# Patient Record
Sex: Female | Born: 1945 | Race: White | Hispanic: No | Smoking: Former smoker
Health system: Southern US, Community
[De-identification: ages and names within clinical notes are randomized; demographics above are authoritative.]

## PROBLEM LIST (undated history)

## (undated) DIAGNOSIS — M797 Fibromyalgia: Secondary | ICD-10-CM

## (undated) DIAGNOSIS — F319 Bipolar disorder, unspecified: Secondary | ICD-10-CM

## (undated) DIAGNOSIS — M1990 Unspecified osteoarthritis, unspecified site: Secondary | ICD-10-CM

## (undated) DIAGNOSIS — R32 Unspecified urinary incontinence: Secondary | ICD-10-CM

## (undated) DIAGNOSIS — F329 Major depressive disorder, single episode, unspecified: Secondary | ICD-10-CM

## (undated) DIAGNOSIS — K219 Gastro-esophageal reflux disease without esophagitis: Secondary | ICD-10-CM

## (undated) DIAGNOSIS — T4145XA Adverse effect of unspecified anesthetic, initial encounter: Secondary | ICD-10-CM

## (undated) DIAGNOSIS — R0602 Shortness of breath: Secondary | ICD-10-CM

## (undated) DIAGNOSIS — E063 Autoimmune thyroiditis: Secondary | ICD-10-CM

## (undated) DIAGNOSIS — T8859XA Other complications of anesthesia, initial encounter: Secondary | ICD-10-CM

## (undated) DIAGNOSIS — E119 Type 2 diabetes mellitus without complications: Secondary | ICD-10-CM

## (undated) DIAGNOSIS — I10 Essential (primary) hypertension: Secondary | ICD-10-CM

## (undated) DIAGNOSIS — F32A Depression, unspecified: Secondary | ICD-10-CM

## (undated) HISTORY — DX: Unspecified osteoarthritis, unspecified site: M19.90

## (undated) HISTORY — PX: TUBAL LIGATION: SHX77

## (undated) HISTORY — PX: COLONOSCOPY: SHX174

## (undated) HISTORY — DX: Fibromyalgia: M79.7

## (undated) HISTORY — DX: Essential (primary) hypertension: I10

## (undated) HISTORY — DX: Type 2 diabetes mellitus without complications: E11.9

## (undated) HISTORY — PX: TONSILLECTOMY: SUR1361

## (undated) HISTORY — DX: Autoimmune thyroiditis: E06.3

## (undated) HISTORY — PX: ANKLE SURGERY: SHX546

## (undated) HISTORY — DX: Unspecified urinary incontinence: R32

---

## 1984-03-24 HISTORY — PX: BREAST SURGERY: SHX581

## 1984-03-24 HISTORY — PX: DILATION AND CURETTAGE OF UTERUS: SHX78

## 2010-10-23 ENCOUNTER — Ambulatory Visit: Payer: Self-pay

## 2012-10-08 ENCOUNTER — Ambulatory Visit: Payer: Self-pay

## 2013-01-07 ENCOUNTER — Encounter (INDEPENDENT_AMBULATORY_CARE_PROVIDER_SITE_OTHER): Payer: Self-pay | Admitting: Surgery

## 2013-01-07 ENCOUNTER — Ambulatory Visit (INDEPENDENT_AMBULATORY_CARE_PROVIDER_SITE_OTHER): Payer: Medicare Other | Admitting: Surgery

## 2013-01-07 ENCOUNTER — Other Ambulatory Visit (INDEPENDENT_AMBULATORY_CARE_PROVIDER_SITE_OTHER): Payer: Self-pay

## 2013-01-07 VITALS — BP 140/70 | HR 76 | Temp 98.9°F | Resp 16 | Ht 62.0 in | Wt 290.6 lb

## 2013-01-07 DIAGNOSIS — M797 Fibromyalgia: Secondary | ICD-10-CM

## 2013-01-07 DIAGNOSIS — R32 Unspecified urinary incontinence: Secondary | ICD-10-CM

## 2013-01-07 DIAGNOSIS — Z862 Personal history of diseases of the blood and blood-forming organs and certain disorders involving the immune mechanism: Secondary | ICD-10-CM

## 2013-01-07 DIAGNOSIS — Z8639 Personal history of other endocrine, nutritional and metabolic disease: Secondary | ICD-10-CM | POA: Insufficient documentation

## 2013-01-07 DIAGNOSIS — E119 Type 2 diabetes mellitus without complications: Secondary | ICD-10-CM

## 2013-01-07 DIAGNOSIS — I10 Essential (primary) hypertension: Secondary | ICD-10-CM

## 2013-01-07 DIAGNOSIS — Z6841 Body Mass Index (BMI) 40.0 and over, adult: Secondary | ICD-10-CM

## 2013-01-07 DIAGNOSIS — IMO0001 Reserved for inherently not codable concepts without codable children: Secondary | ICD-10-CM

## 2013-01-07 NOTE — Patient Instructions (Signed)
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
## 2013-01-10 ENCOUNTER — Other Ambulatory Visit (INDEPENDENT_AMBULATORY_CARE_PROVIDER_SITE_OTHER): Payer: Self-pay

## 2013-01-10 DIAGNOSIS — E119 Type 2 diabetes mellitus without complications: Secondary | ICD-10-CM

## 2013-01-10 DIAGNOSIS — M797 Fibromyalgia: Secondary | ICD-10-CM

## 2013-01-10 DIAGNOSIS — I10 Essential (primary) hypertension: Secondary | ICD-10-CM

## 2013-01-21 ENCOUNTER — Ambulatory Visit (HOSPITAL_COMMUNITY): Admission: RE | Admit: 2013-01-21 | Payer: Self-pay | Source: Ambulatory Visit | Admitting: Surgery

## 2013-01-21 SURGERY — BREATH TEST, FOR HELICOBACTER PYLORI

## 2013-01-27 ENCOUNTER — Other Ambulatory Visit: Payer: Self-pay

## 2013-01-27 ENCOUNTER — Other Ambulatory Visit (INDEPENDENT_AMBULATORY_CARE_PROVIDER_SITE_OTHER): Payer: Self-pay | Admitting: Surgery

## 2013-01-27 ENCOUNTER — Ambulatory Visit (HOSPITAL_COMMUNITY)
Admission: RE | Admit: 2013-01-27 | Discharge: 2013-01-27 | Disposition: A | Discharge status: Home / Self Care | Payer: Medicare Other | Source: Ambulatory Visit | Attending: Surgery | Admitting: Surgery

## 2013-01-27 DIAGNOSIS — I10 Essential (primary) hypertension: Secondary | ICD-10-CM

## 2013-01-27 DIAGNOSIS — IMO0001 Reserved for inherently not codable concepts without codable children: Secondary | ICD-10-CM | POA: Insufficient documentation

## 2013-01-27 DIAGNOSIS — K219 Gastro-esophageal reflux disease without esophagitis: Secondary | ICD-10-CM | POA: Insufficient documentation

## 2013-01-27 DIAGNOSIS — E063 Autoimmune thyroiditis: Secondary | ICD-10-CM | POA: Insufficient documentation

## 2013-01-27 DIAGNOSIS — M797 Fibromyalgia: Secondary | ICD-10-CM

## 2013-01-27 DIAGNOSIS — K449 Diaphragmatic hernia without obstruction or gangrene: Secondary | ICD-10-CM | POA: Insufficient documentation

## 2013-01-27 DIAGNOSIS — Z8639 Personal history of other endocrine, nutritional and metabolic disease: Secondary | ICD-10-CM

## 2013-01-27 DIAGNOSIS — E119 Type 2 diabetes mellitus without complications: Secondary | ICD-10-CM

## 2013-01-27 DIAGNOSIS — M129 Arthropathy, unspecified: Secondary | ICD-10-CM | POA: Insufficient documentation

## 2013-01-27 DIAGNOSIS — Z1231 Encounter for screening mammogram for malignant neoplasm of breast: Secondary | ICD-10-CM | POA: Insufficient documentation

## 2013-01-27 DIAGNOSIS — Z7189 Other specified counseling: Secondary | ICD-10-CM

## 2013-01-27 DIAGNOSIS — E669 Obesity, unspecified: Secondary | ICD-10-CM

## 2013-01-27 DIAGNOSIS — R32 Unspecified urinary incontinence: Secondary | ICD-10-CM | POA: Insufficient documentation

## 2013-01-27 DIAGNOSIS — K224 Dyskinesia of esophagus: Secondary | ICD-10-CM | POA: Insufficient documentation

## 2013-01-27 DIAGNOSIS — Z6841 Body Mass Index (BMI) 40.0 and over, adult: Secondary | ICD-10-CM | POA: Insufficient documentation

## 2013-01-27 LAB — CBC WITH DIFFERENTIAL/PLATELET
Basophils Absolute: 0 10*3/uL (ref 0.0–0.1)
Basophils Relative: 0 % (ref 0–1)
Eosinophils Absolute: 0.1 10*3/uL (ref 0.0–0.7)
HCT: 39.4 % (ref 36.0–46.0)
MCH: 31.2 pg (ref 26.0–34.0)
MCHC: 34.3 g/dL (ref 30.0–36.0)
Neutrophils Relative %: 51 % (ref 43–77)
Platelets: 221 10*3/uL (ref 150–400)
RBC: 4.33 MIL/uL (ref 3.87–5.11)
RDW: 13.8 % (ref 11.5–15.5)

## 2013-01-27 LAB — HEMOGLOBIN A1C: Hgb A1c MFr Bld: 6.3 % — ABNORMAL HIGH (ref <5.7)

## 2013-01-28 LAB — T4: T4, Total: 11.3 ug/dL (ref 5.0–12.5)

## 2013-02-02 ENCOUNTER — Ambulatory Visit: Payer: Self-pay | Admitting: Dietician

## 2013-03-03 ENCOUNTER — Encounter (HOSPITAL_COMMUNITY)
Admission: RE | Disposition: A | Discharge status: Home / Self Care | Payer: No Typology Code available for payment source | Source: Ambulatory Visit | Attending: Surgery

## 2013-03-03 ENCOUNTER — Ambulatory Visit (HOSPITAL_COMMUNITY)
Admission: RE | Admit: 2013-03-03 | Discharge: 2013-03-03 | Disposition: A | Discharge status: Home / Self Care | Payer: Medicare Other | Source: Ambulatory Visit | Attending: Surgery | Admitting: Surgery

## 2013-03-03 ENCOUNTER — Ambulatory Visit: Payer: Medicare Other | Admitting: Dietician

## 2013-03-03 HISTORY — PX: BREATH TEK H PYLORI: SHX5422

## 2013-03-03 SURGERY — BREATH TEST, FOR HELICOBACTER PYLORI

## 2013-03-04 ENCOUNTER — Encounter: Payer: Medicare Other | Attending: Surgery | Admitting: Dietician

## 2013-03-04 ENCOUNTER — Encounter (HOSPITAL_COMMUNITY): Payer: Self-pay | Admitting: Surgery

## 2013-03-04 DIAGNOSIS — Z713 Dietary counseling and surveillance: Secondary | ICD-10-CM | POA: Insufficient documentation

## 2013-03-04 NOTE — Patient Instructions (Signed)
Patient to call the [facility] to enroll in Pre-Op and Post-Op Nutrition Education when surgery date is scheduled. 

## 2013-03-04 NOTE — Progress Notes (Signed)
  Pre-Op Assessment Visit:  Pre-Operative LAGB Surgery  Medical Nutrition Therapy:  Appt start time: 1100   End time:  1200.  Patient was seen on 03/04/13 for Pre-Operative LAGB Nutrition Assessment. Assessment and letter of approval faxed to [facility] Bariatric Surgery Program coordinator on 03/04/13.   Handouts given during visit include:  Pre-Op Goals Bariatric Surgery Protein Shakes  Patient to call the [facility] to enroll in Pre-Op and Post-Op Nutrition Education when surgery date is scheduled.

## 2013-05-19 ENCOUNTER — Other Ambulatory Visit (INDEPENDENT_AMBULATORY_CARE_PROVIDER_SITE_OTHER): Payer: Self-pay | Admitting: Surgery

## 2013-05-26 ENCOUNTER — Ambulatory Visit: Payer: Medicare Other

## 2013-05-26 ENCOUNTER — Encounter: Payer: Medicare Other | Attending: Surgery

## 2013-05-26 DIAGNOSIS — Z713 Dietary counseling and surveillance: Secondary | ICD-10-CM | POA: Insufficient documentation

## 2013-05-28 NOTE — Patient Instructions (Signed)
Patient will follow-up at [facility] 2 weeks post operatively for diet advancement per MD. 

## 2013-05-28 NOTE — Progress Notes (Signed)
  Pre-Operative Nutrition Class:  Appt start time: 530   End time:  730  Patient was seen on 05/26/2013 for Pre-Operative Bariatric Surgery Education at [facility].   Surgery date: 06/21/2013 Surgery type: LAGB Start weight at [facility]: 290 lbs Weight today:   TANITA  BODY COMP RESULTS     BMI (kg/m^2)    Fat Mass (lbs)    Fat Free Mass (lbs)    Total Body Water (lbs)    Samples given per MNT protocol. Patient educated on appropriate usage: Bariatric Advantage Vitamin B12 (black cherry) Lot #: [id number] Exp: 10/15  Bariactiv Calcium Citrate (berry) Lot #: [id number] Exp:5/16  Celebrate Vitamins Multivitamin (orange) Lot #: [id number] Exp:7/15  Unjury Protein Powder (unflavored) Lot #: [id number] Exp: 4/16  Premier Protein (strawberry): Lot #: [id number] Exp: 12/15  The following the learning objectives were met by the patient during this course:  Identify Pre-Op Dietary Goals and will begin 2 weeks pre-operatively  Identify appropriate sources of fluids and proteins   State protein recommendations and appropriate sources pre and post-operatively  Identify Post-Operative Dietary Goals and will follow for 2 weeks post-operatively  Identify appropriate multivitamin and calcium sources  Describe the need for physical activity post-operatively and will follow MD recommendations  State when to call healthcare provider regarding medication questions or post-operative complications  Handouts given during class include:  Pre-Op Bariatric Surgery Diet Handout  Protein Shake Handout  Post-Op Bariatric Surgery Nutrition Handout  BELT Program Information Flyer  Support Group Information Flyer  [facility] Bariatric Supplements Price List  Follow-Up Plan: Patient will follow-up at [facility] 2 weeks post operatively for diet advancement per MD.

## 2013-06-03 ENCOUNTER — Encounter (HOSPITAL_COMMUNITY): Payer: Self-pay | Admitting: *Deleted

## 2013-06-09 ENCOUNTER — Ambulatory Visit: Payer: Medicare Other

## 2013-06-09 ENCOUNTER — Encounter (INDEPENDENT_AMBULATORY_CARE_PROVIDER_SITE_OTHER): Payer: Self-pay | Admitting: Surgery

## 2013-06-09 ENCOUNTER — Ambulatory Visit (INDEPENDENT_AMBULATORY_CARE_PROVIDER_SITE_OTHER): Payer: Medicare Other | Admitting: Surgery

## 2013-06-09 DIAGNOSIS — E119 Type 2 diabetes mellitus without complications: Secondary | ICD-10-CM

## 2013-06-09 MED ORDER — HYDROCODONE-ACETAMINOPHEN 7.5-325 MG/15ML PO SOLN: 10.0000 mL | Freq: Four times a day (QID) | ORAL | Status: DC | PRN
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
## 2013-06-09 NOTE — Patient Instructions (Signed)

## 2013-06-10 ENCOUNTER — Encounter (HOSPITAL_COMMUNITY): Payer: Self-pay | Admitting: Pharmacy Technician

## 2013-06-13 ENCOUNTER — Encounter (HOSPITAL_COMMUNITY): Payer: Self-pay

## 2013-06-13 ENCOUNTER — Ambulatory Visit (HOSPITAL_COMMUNITY)
Admission: RE | Admit: 2013-06-13 | Discharge: 2013-06-13 | Disposition: A | Discharge status: Home / Self Care | Payer: Medicare Other | Source: Ambulatory Visit | Attending: Surgery | Admitting: Surgery

## 2013-06-13 ENCOUNTER — Encounter (HOSPITAL_COMMUNITY)
Admission: RE | Admit: 2013-06-13 | Discharge: 2013-06-13 | Disposition: A | Discharge status: Home / Self Care | Payer: Medicare Other | Source: Ambulatory Visit | Attending: Surgery | Admitting: Surgery

## 2013-06-13 DIAGNOSIS — Z01812 Encounter for preprocedural laboratory examination: Secondary | ICD-10-CM | POA: Insufficient documentation

## 2013-06-13 DIAGNOSIS — Z01818 Encounter for other preprocedural examination: Secondary | ICD-10-CM | POA: Insufficient documentation

## 2013-06-13 HISTORY — DX: Major depressive disorder, single episode, unspecified: F32.9

## 2013-06-13 HISTORY — DX: Gastro-esophageal reflux disease without esophagitis: K21.9

## 2013-06-13 HISTORY — DX: Other complications of anesthesia, initial encounter: T88.59XA

## 2013-06-13 HISTORY — DX: Shortness of breath: R06.02

## 2013-06-13 HISTORY — DX: Bipolar disorder, unspecified: F31.9

## 2013-06-13 HISTORY — DX: Depression, unspecified: F32.A

## 2013-06-13 HISTORY — DX: Adverse effect of unspecified anesthetic, initial encounter: T41.45XA

## 2013-06-13 LAB — COMPREHENSIVE METABOLIC PANEL
ALT: 63 U/L — AB (ref 0–35)
AST: 54 U/L — AB (ref 0–37)
Albumin: 3.8 g/dL (ref 3.5–5.2)
Alkaline Phosphatase: 88 U/L (ref 39–117)
BUN: 24 mg/dL — ABNORMAL HIGH (ref 6–23)
CO2: 30 meq/L (ref 19–32)
CREATININE: 0.65 mg/dL (ref 0.50–1.10)
Calcium: 9.8 mg/dL (ref 8.4–10.5)
Chloride: 96 mEq/L (ref 96–112)
GFR calc non Af Amer: >90 mL/min (ref >90)
GLUCOSE: 117 mg/dL — AB (ref 70–99)
Potassium: 5.1 mEq/L (ref 3.7–5.3)
Sodium: 138 mEq/L (ref 137–147)
TOTAL PROTEIN: 7.5 g/dL (ref 6.0–8.3)
Total Bilirubin: 0.3 mg/dL (ref 0.3–1.2)

## 2013-06-13 LAB — CBC WITH DIFFERENTIAL/PLATELET
BASOS ABS: 0 10*3/uL (ref 0.0–0.1)
Basophils Relative: 0 % (ref 0–1)
EOS ABS: 0.2 10*3/uL (ref 0.0–0.7)
EOS PCT: 3 % (ref 0–5)
HEMATOCRIT: 44.2 % (ref 36.0–46.0)
Hemoglobin: 14.8 g/dL (ref 12.0–15.0)
Lymphocytes Relative: 35 % (ref 12–46)
Lymphs Abs: 2.2 10*3/uL (ref 0.7–4.0)
MCH: 32.1 pg (ref 26.0–34.0)
MCHC: 33.5 g/dL (ref 30.0–36.0)
MCV: 95.9 fL (ref 78.0–100.0)
MONO ABS: 0.6 10*3/uL (ref 0.1–1.0)
Monocytes Relative: 9 % (ref 3–12)
Neutro Abs: 3.4 10*3/uL (ref 1.7–7.7)
Neutrophils Relative %: 54 % (ref 43–77)
Platelets: 164 10*3/uL (ref 150–400)
RBC: 4.61 MIL/uL (ref 3.87–5.11)
RDW: 12.7 % (ref 11.5–15.5)
WBC: 6.3 10*3/uL (ref 4.0–10.5)

## 2013-06-13 NOTE — Progress Notes (Signed)
06/13/13 1133  OBSTRUCTIVE SLEEP APNEA  Have you ever been diagnosed with sleep apnea through a sleep study? No  Do you snore loudly (loud enough to be heard through closed doors)?  1  Do you often feel tired, fatigued, or sleepy during the daytime? 1  Has anyone observed you stop breathing during your sleep? 0  Do you have, or are you being treated for high blood pressure? 1  BMI more than 35 kg/m2? 1  Age over 66 years old? 1  Neck circumference greater than 40 cm/18 inches? 0  Gender: 0  Obstructive Sleep Apnea Score 5  Score 4 or greater  Results sent to PCP

## 2013-06-13 NOTE — Patient Instructions (Signed)
Lorita OfficerSharon K Madarang  06/13/2013                           YOUR PROCEDURE IS SCHEDULED ON: 06/21/13               PLEASE REPORT TO SHORT STAY CENTER AT : 5:15 AM               CALL THIS NUMBER IF ANY PROBLEMS THE DAY OF SURGERY :               [phone]                                REMEMBER:   Do not eat food or drink liquids AFTER MIDNIGHT                 Take these medicines the morning of surgery with A SIP OF WATER: CYMBALTA / LEVOTHYROXINE / LYRICA   Do not wear jewelry, make-up   Do not wear lotions, powders, or perfumes.   Do not shave legs or underarms 12 hrs. before surgery (men may shave face)  Do not bring valuables to the hospital.  Contacts, dentures or bridgework may not be worn into surgery.  Leave suitcase in the car. After surgery it may be brought to your room.  For patients admitted to the hospital more than one night, checkout time is            11:00 AM                                                       The day of discharge.   Patients discharged the day of surgery will not be allowed to drive home.            If going home same day of surgery, must have someone stay with you              FIRST 24 hrs at home and arrange for some one to drive you              home from hospital.    Special Instructions             Please read over the following fact sheets that you were given:               1. [place] PREPARING FOR SURGERY SHEET               2. STOP ASPIRIN / ALEVE / MOTRIN /IBUPROFEN / HERBAL MEDS 7 DAYS PREOP                                                X_____________________________________________________________________        Failure to follow these instructions may result in cancellation of your surgery

## 2013-06-20 ENCOUNTER — Other Ambulatory Visit (INDEPENDENT_AMBULATORY_CARE_PROVIDER_SITE_OTHER): Payer: Self-pay | Admitting: Surgery
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
## 2013-06-21 ENCOUNTER — Observation Stay (HOSPITAL_COMMUNITY)
Admission: RE | Admit: 2013-06-21 | Discharge: 2013-06-22 | Disposition: A | Discharge status: Home / Self Care | Payer: Medicare Other | Source: Ambulatory Visit | Attending: Surgery | Admitting: Surgery

## 2013-06-21 ENCOUNTER — Encounter (HOSPITAL_COMMUNITY)
Admission: RE | Disposition: A | Discharge status: Home / Self Care | Payer: Self-pay | Source: Ambulatory Visit | Attending: Surgery

## 2013-06-21 ENCOUNTER — Encounter (HOSPITAL_COMMUNITY): Payer: Self-pay | Admitting: *Deleted

## 2013-06-21 ENCOUNTER — Ambulatory Visit (HOSPITAL_COMMUNITY): Payer: Medicare Other | Admitting: Anesthesiology

## 2013-06-21 ENCOUNTER — Encounter (HOSPITAL_COMMUNITY): Payer: Medicare Other | Admitting: Anesthesiology

## 2013-06-21 DIAGNOSIS — I10 Essential (primary) hypertension: Secondary | ICD-10-CM | POA: Insufficient documentation

## 2013-06-21 DIAGNOSIS — E119 Type 2 diabetes mellitus without complications: Secondary | ICD-10-CM

## 2013-06-21 DIAGNOSIS — F3130 Bipolar disorder, current episode depressed, mild or moderate severity, unspecified: Secondary | ICD-10-CM | POA: Insufficient documentation

## 2013-06-21 DIAGNOSIS — Z79899 Other long term (current) drug therapy: Secondary | ICD-10-CM | POA: Insufficient documentation

## 2013-06-21 DIAGNOSIS — Z6841 Body Mass Index (BMI) 40.0 and over, adult: Secondary | ICD-10-CM | POA: Insufficient documentation

## 2013-06-21 DIAGNOSIS — Z87891 Personal history of nicotine dependence: Secondary | ICD-10-CM | POA: Insufficient documentation

## 2013-06-21 DIAGNOSIS — Z9884 Bariatric surgery status: Secondary | ICD-10-CM

## 2013-06-21 DIAGNOSIS — E063 Autoimmune thyroiditis: Secondary | ICD-10-CM | POA: Insufficient documentation

## 2013-06-21 DIAGNOSIS — IMO0001 Reserved for inherently not codable concepts without codable children: Secondary | ICD-10-CM | POA: Insufficient documentation

## 2013-06-21 DIAGNOSIS — K219 Gastro-esophageal reflux disease without esophagitis: Secondary | ICD-10-CM | POA: Insufficient documentation

## 2013-06-21 HISTORY — PX: LAPAROSCOPIC GASTRIC BANDING: SHX1100

## 2013-06-21 HISTORY — PX: MESH APPLIED TO LAP PORT: SHX5969

## 2013-06-21 LAB — CBC
HCT: 46.3 % — ABNORMAL HIGH (ref 36.0–46.0)
HEMOGLOBIN: 15.1 g/dL — AB (ref 12.0–15.0)
MCH: 32 pg (ref 26.0–34.0)
MCHC: 32.6 g/dL (ref 30.0–36.0)
MCV: 98.1 fL (ref 78.0–100.0)
Platelets: 129 10*3/uL — ABNORMAL LOW (ref 150–400)
RBC: 4.72 MIL/uL (ref 3.87–5.11)
RDW: 13.1 % (ref 11.5–15.5)
WBC: 7.2 10*3/uL (ref 4.0–10.5)

## 2013-06-21 LAB — HEMOGLOBIN A1C
HEMOGLOBIN A1C: 6.5 % — AB (ref <5.7)
Mean Plasma Glucose: 140 mg/dL — ABNORMAL HIGH (ref <117)

## 2013-06-21 LAB — CREATININE, SERUM
CREATININE: 0.68 mg/dL (ref 0.50–1.10)
GFR calc Af Amer: >90 mL/min (ref >90)
GFR calc non Af Amer: 90 mL/min — ABNORMAL LOW (ref >90)

## 2013-06-21 LAB — GLUCOSE, CAPILLARY
GLUCOSE-CAPILLARY: 133 mg/dL — AB (ref 70–99)
GLUCOSE-CAPILLARY: 134 mg/dL — AB (ref 70–99)
Glucose-Capillary: 177 mg/dL — ABNORMAL HIGH (ref 70–99)
Glucose-Capillary: 173 mg/dL — ABNORMAL HIGH (ref 70–99)
Glucose-Capillary: 171 mg/dL — ABNORMAL HIGH (ref 70–99)
Glucose-Capillary: 159 mg/dL — ABNORMAL HIGH (ref 70–99)

## 2013-06-21 SURGERY — GASTRIC BANDING, LAPAROSCOPIC
Anesthesia: General | Site: Abdomen

## 2013-06-21 MED ORDER — NALOXONE HCL 0.4 MG/ML IJ SOLN
INTRAMUSCULAR | Status: DC | PRN
  Administered 2013-06-21: 40 ug via INTRAVENOUS

## 2013-06-21 MED ORDER — CHLORHEXIDINE GLUCONATE CLOTH 2 % EX PADS: 6.0000 | MEDICATED_PAD | Freq: Once | CUTANEOUS | Status: DC

## 2013-06-21 MED ORDER — ONDANSETRON HCL 4 MG/2ML IJ SOLN: 4.0000 mg | INTRAMUSCULAR | Status: DC | PRN

## 2013-06-21 MED ORDER — GLYCOPYRROLATE 0.2 MG/ML IJ SOLN
INTRAMUSCULAR | Status: AC
  Filled 2013-06-21: qty 1

## 2013-06-21 MED ORDER — PHENYLEPHRINE HCL 10 MG/ML IJ SOLN
INTRAMUSCULAR | Status: DC | PRN
  Administered 2013-06-21 (×3): 120 ug via INTRAVENOUS

## 2013-06-21 MED ORDER — NEOSTIGMINE METHYLSULFATE 1 MG/ML IJ SOLN
INTRAMUSCULAR | Status: DC | PRN
  Administered 2013-06-21: 5 mg via INTRAVENOUS

## 2013-06-21 MED ORDER — LIDOCAINE HCL (CARDIAC) 20 MG/ML IV SOLN
INTRAVENOUS | Status: DC | PRN
  Administered 2013-06-21 (×2): 25 mg via INTRAVENOUS
  Administered 2013-06-21: 75 mg via INTRAVENOUS

## 2013-06-21 MED ORDER — PHENYLEPHRINE 40 MCG/ML (10ML) SYRINGE FOR IV PUSH (FOR BLOOD PRESSURE SUPPORT)
PREFILLED_SYRINGE | INTRAVENOUS | Status: AC
  Filled 2013-06-21: qty 10

## 2013-06-21 MED ORDER — MORPHINE SULFATE 2 MG/ML IJ SOLN
2.0000 mg | INTRAMUSCULAR | Status: DC | PRN
  Administered 2013-06-21 – 2013-06-22 (×6): 2 mg via INTRAVENOUS
  Filled 2013-06-21 (×6): qty 1

## 2013-06-21 MED ORDER — LACTATED RINGERS IV SOLN
INTRAVENOUS | Status: DC | PRN
  Administered 2013-06-21 (×2): via INTRAVENOUS

## 2013-06-21 MED ORDER — LIDOCAINE HCL (CARDIAC) 20 MG/ML IV SOLN
INTRAVENOUS | Status: AC
  Filled 2013-06-21: qty 5

## 2013-06-21 MED ORDER — EPHEDRINE SULFATE 50 MG/ML IJ SOLN
INTRAMUSCULAR | Status: DC | PRN
  Administered 2013-06-21: 5 mg via INTRAVENOUS

## 2013-06-21 MED ORDER — NEOSTIGMINE METHYLSULFATE 1 MG/ML IJ SOLN
INTRAMUSCULAR | Status: AC
  Filled 2013-06-21: qty 10

## 2013-06-21 MED ORDER — UNJURY CHICKEN SOUP POWDER
2.0000 [oz_av] | Freq: Four times a day (QID) | ORAL | Status: DC
  Administered 2013-06-22: 2 [oz_av] via ORAL

## 2013-06-21 MED ORDER — SUFENTANIL CITRATE 50 MCG/ML IV SOLN
INTRAVENOUS | Status: AC
  Filled 2013-06-21: qty 1

## 2013-06-21 MED ORDER — MIDAZOLAM HCL 2 MG/2ML IJ SOLN
INTRAMUSCULAR | Status: AC
  Filled 2013-06-21: qty 2

## 2013-06-21 MED ORDER — ACETAMINOPHEN 160 MG/5ML PO SOLN: 650.0000 mg | ORAL | Status: DC | PRN

## 2013-06-21 MED ORDER — FENTANYL CITRATE 0.05 MG/ML IJ SOLN
INTRAMUSCULAR | Status: AC
  Filled 2013-06-21: qty 5

## 2013-06-21 MED ORDER — SUCCINYLCHOLINE CHLORIDE 20 MG/ML IJ SOLN
INTRAMUSCULAR | Status: DC | PRN
  Administered 2013-06-21: 100 mg via INTRAVENOUS

## 2013-06-21 MED ORDER — LACTATED RINGERS IV SOLN: INTRAVENOUS | Status: DC

## 2013-06-21 MED ORDER — LABETALOL HCL 5 MG/ML IV SOLN
INTRAVENOUS | Status: AC
  Filled 2013-06-21: qty 4

## 2013-06-21 MED ORDER — POTASSIUM CHLORIDE IN NACL 20-0.45 MEQ/L-% IV SOLN
INTRAVENOUS | Status: DC
  Administered 2013-06-21 (×2): via INTRAVENOUS
  Administered 2013-06-22: 100 mL/h via INTRAVENOUS
  Filled 2013-06-21 (×5): qty 1000

## 2013-06-21 MED ORDER — HEPARIN SODIUM (PORCINE) 5000 UNIT/ML IJ SOLN
5000.0000 [IU] | INTRAMUSCULAR | Status: AC
  Administered 2013-06-21: 5000 [IU] via SUBCUTANEOUS
  Filled 2013-06-21: qty 1

## 2013-06-21 MED ORDER — PROPOFOL 10 MG/ML IV BOLUS
INTRAVENOUS | Status: AC
  Filled 2013-06-21: qty 20

## 2013-06-21 MED ORDER — DEXAMETHASONE SODIUM PHOSPHATE 10 MG/ML IJ SOLN
INTRAMUSCULAR | Status: DC | PRN
Start: 2013-06-21 — End: 2013-06-21
  Administered 2013-06-21: 10 mg via INTRAVENOUS

## 2013-06-21 MED ORDER — FENTANYL CITRATE 0.05 MG/ML IJ SOLN
INTRAMUSCULAR | Status: DC | PRN
  Administered 2013-06-21: 100 ug via INTRAVENOUS
  Administered 2013-06-21: 50 ug via INTRAVENOUS
  Administered 2013-06-21: 100 ug via INTRAVENOUS

## 2013-06-21 MED ORDER — HEPARIN SODIUM (PORCINE) 5000 UNIT/ML IJ SOLN
5000.0000 [IU] | Freq: Three times a day (TID) | INTRAMUSCULAR | Status: DC
  Administered 2013-06-21 – 2013-06-22 (×4): 5000 [IU] via SUBCUTANEOUS
  Filled 2013-06-21 (×7): qty 1

## 2013-06-21 MED ORDER — LACTATED RINGERS IR SOLN
Status: DC | PRN
  Administered 2013-06-21: 1000 mL

## 2013-06-21 MED ORDER — UNJURY CHOCOLATE CLASSIC POWDER
2.0000 [oz_av] | Freq: Four times a day (QID) | ORAL | Status: DC
  Administered 2013-06-22 (×2): 2 [oz_av] via ORAL

## 2013-06-21 MED ORDER — PROPOFOL 10 MG/ML IV BOLUS
INTRAVENOUS | Status: DC | PRN
  Administered 2013-06-21: 180 mg via INTRAVENOUS

## 2013-06-21 MED ORDER — SODIUM CHLORIDE 0.9 % IJ SOLN
INTRAMUSCULAR | Status: AC
Start: 2013-06-21 — End: 2013-06-21
  Filled 2013-06-21: qty 10

## 2013-06-21 MED ORDER — DEXAMETHASONE SODIUM PHOSPHATE 10 MG/ML IJ SOLN
INTRAMUSCULAR | Status: AC
  Filled 2013-06-21: qty 1

## 2013-06-21 MED ORDER — ACETAMINOPHEN 10 MG/ML IV SOLN
1000.0000 mg | Freq: Once | INTRAVENOUS | Status: DC
  Filled 2013-06-21: qty 100

## 2013-06-21 MED ORDER — ACETAMINOPHEN 160 MG/5ML PO SOLN: 325.0000 mg | ORAL | Status: DC | PRN

## 2013-06-21 MED ORDER — SODIUM CHLORIDE 0.9 % IJ SOLN
INTRAMUSCULAR | Status: AC
Start: 2013-06-21 — End: 2013-06-21
  Filled 2013-06-21: qty 50

## 2013-06-21 MED ORDER — SODIUM CHLORIDE 0.9 % IJ SOLN
INTRAMUSCULAR | Status: DC | PRN
  Administered 2013-06-21: 10 mL via INTRAVENOUS

## 2013-06-21 MED ORDER — ONDANSETRON HCL 4 MG/2ML IJ SOLN
INTRAMUSCULAR | Status: AC
  Filled 2013-06-21: qty 2

## 2013-06-21 MED ORDER — DEXTROSE 5 % IV SOLN
INTRAVENOUS | Status: AC
  Filled 2013-06-21: qty 2

## 2013-06-21 MED ORDER — GLYCOPYRROLATE 0.2 MG/ML IJ SOLN
INTRAMUSCULAR | Status: DC | PRN
  Administered 2013-06-21: .8 mg via INTRAVENOUS

## 2013-06-21 MED ORDER — HYDROMORPHONE HCL PF 1 MG/ML IJ SOLN: 0.2500 mg | INTRAMUSCULAR | Status: DC | PRN

## 2013-06-21 MED ORDER — ROCURONIUM BROMIDE 100 MG/10ML IV SOLN
INTRAVENOUS | Status: DC | PRN
Start: 2013-06-21 — End: 2013-06-21
  Administered 2013-06-21: 5 mg via INTRAVENOUS
  Administered 2013-06-21: 45 mg via INTRAVENOUS

## 2013-06-21 MED ORDER — ONDANSETRON HCL 4 MG/2ML IJ SOLN
INTRAMUSCULAR | Status: DC | PRN
  Administered 2013-06-21: 4 mg via INTRAVENOUS

## 2013-06-21 MED ORDER — UNJURY VANILLA POWDER: 2.0000 [oz_av] | Freq: Four times a day (QID) | ORAL | Status: DC

## 2013-06-21 MED ORDER — INSULIN ASPART 100 UNIT/ML ~~LOC~~ SOLN
0.0000 [IU] | SUBCUTANEOUS | Status: DC
  Administered 2013-06-21 (×3): 4 [IU] via SUBCUTANEOUS
  Administered 2013-06-21 – 2013-06-22 (×3): 3 [IU] via SUBCUTANEOUS

## 2013-06-21 MED ORDER — CEFOXITIN SODIUM 2 G IV SOLR
2.0000 g | INTRAVENOUS | Status: AC
  Administered 2013-06-21: 2 g via INTRAVENOUS

## 2013-06-21 MED ORDER — OXYCODONE HCL 5 MG/5ML PO SOLN
5.0000 mg | ORAL | Status: DC | PRN
  Administered 2013-06-22 (×3): 5 mg via ORAL
  Filled 2013-06-21 (×2): qty 5
  Filled 2013-06-21: qty 25

## 2013-06-21 MED ORDER — ROCURONIUM BROMIDE 100 MG/10ML IV SOLN
INTRAVENOUS | Status: AC
  Filled 2013-06-21: qty 1

## 2013-06-21 MED ORDER — SUCCINYLCHOLINE CHLORIDE 20 MG/ML IJ SOLN
INTRAMUSCULAR | Status: AC
  Filled 2013-06-21: qty 1

## 2013-06-21 MED ORDER — PROPOFOL INFUSION 10 MG/ML OPTIME
INTRAVENOUS | Status: DC | PRN
  Administered 2013-06-21: 75 ug/kg/min via INTRAVENOUS

## 2013-06-21 MED ORDER — BUPIVACAINE LIPOSOME 1.3 % IJ SUSP
20.0000 mL | Freq: Once | INTRAMUSCULAR | Status: AC
  Administered 2013-06-21: 20 mL
  Filled 2013-06-21: qty 20

## 2013-06-21 SURGICAL SUPPLY — 58 items
BAND LAP 10.0 W/TUBES (Band) ×3 IMPLANT
BAND LAP 10.0CM W/TUBES (Band) ×1 IMPLANT
BENZOIN TINCTURE PRP APPL 2/3 (GAUZE/BANDAGES/DRESSINGS) IMPLANT
BLADE HEX COATED 2.75 (ELECTRODE) ×4 IMPLANT
BLADE SURG 15 STRL LF DISP TIS (BLADE) ×2 IMPLANT
BLADE SURG 15 STRL SS (BLADE) ×2
CANISTER SUCTION 2500CC (MISCELLANEOUS) IMPLANT
CLOSURE WOUND 1/2 X4 (GAUZE/BANDAGES/DRESSINGS)
DECANTER SPIKE VIAL GLASS SM (MISCELLANEOUS) ×4 IMPLANT
DEVICE SUT QUICK LOAD TK 5 (STAPLE) ×9 IMPLANT
DEVICE SUT TI-KNOT TK 5X26 (MISCELLANEOUS) ×3 IMPLANT
DEVICE SUTURE ENDOST 10MM (ENDOMECHANICALS) IMPLANT
DEVICE TI KNOT TK5 (MISCELLANEOUS) ×1
DISSECTOR BLUNT TIP ENDO 5MM (MISCELLANEOUS) IMPLANT
DRAPE CAMERA CLOSED 9X96 (DRAPES) ×4 IMPLANT
ELECT REM PT RETURN 9FT ADLT (ELECTROSURGICAL) ×4
ELECTRODE REM PT RTRN 9FT ADLT (ELECTROSURGICAL) ×2 IMPLANT
GLOVE BIOGEL M 8.0 STRL (GLOVE) ×4 IMPLANT
GOWN STRL REUS W/TWL XL LVL3 (GOWN DISPOSABLE) ×20 IMPLANT
HOVERMATT SINGLE USE (MISCELLANEOUS) ×4 IMPLANT
KIT BASIN OR (CUSTOM PROCEDURE TRAY) ×4 IMPLANT
MESH HERNIA 1X4 RECT BARD (Mesh General) ×2 IMPLANT
MESH HERNIA BARD 1X4 (Mesh General) ×2 IMPLANT
NEEDLE SPNL 22GX3.5 QUINCKE BK (NEEDLE) ×4 IMPLANT
NS IRRIG 1000ML POUR BTL (IV SOLUTION) ×4 IMPLANT
PACK UNIVERSAL I (CUSTOM PROCEDURE TRAY) ×4 IMPLANT
PENCIL BUTTON HOLSTER BLD 10FT (ELECTRODE) ×8 IMPLANT
QUICK LOAD TK 5 (STAPLE) ×3
SCALPEL HARMONIC ACE (MISCELLANEOUS) IMPLANT
SET IRRIG TUBING LAPAROSCOPIC (IRRIGATION / IRRIGATOR) ×4 IMPLANT
SHEARS CURVED HARMONIC AC 45CM (MISCELLANEOUS) IMPLANT
SLEEVE ADV FIXATION 5X100MM (TROCAR) ×4 IMPLANT
SLEEVE XCEL OPT CAN 5 100 (ENDOMECHANICALS) IMPLANT
SOLUTION ANTI FOG 6CC (MISCELLANEOUS) ×4 IMPLANT
SPONGE GAUZE 4X4 12PLY (GAUZE/BANDAGES/DRESSINGS) ×4 IMPLANT
SPONGE LAP 18X18 X RAY DECT (DISPOSABLE) ×4 IMPLANT
STAPLER VISISTAT 35W (STAPLE) ×4 IMPLANT
STRIP CLOSURE SKIN 1/2X4 (GAUZE/BANDAGES/DRESSINGS) IMPLANT
SUT DEVICE BRAIDED 2.0X39 (SUTURE) ×8 IMPLANT
SUT ETHIBOND 2 0 SH (SUTURE) ×6
SUT ETHIBOND 2 0 SH 36X2 (SUTURE) ×6 IMPLANT
SUT PROLENE 2 0 CT2 30 (SUTURE) ×4 IMPLANT
SUT SILK 0 (SUTURE) ×2
SUT SILK 0 30XBRD TIE 6 (SUTURE) ×2 IMPLANT
SUT SURGIDAC NAB ES-9 0 48 120 (SUTURE) IMPLANT
SUT VIC AB 2-0 SH 27 (SUTURE)
SUT VIC AB 2-0 SH 27X BRD (SUTURE) IMPLANT
SUT VIC AB 4-0 SH 18 (SUTURE) ×4 IMPLANT
SYR 20CC LL (SYRINGE) ×8 IMPLANT
TOWEL OR 17X26 10 PK STRL BLUE (TOWEL DISPOSABLE) ×8 IMPLANT
TOWEL OR NON WOVEN STRL DISP B (DISPOSABLE) ×4 IMPLANT
TROCAR ADV FIXATION 11X100MM (TROCAR) ×4 IMPLANT
TROCAR BLADELESS 15MM (ENDOMECHANICALS) ×4 IMPLANT
TROCAR BLADELESS OPT 5 100 (ENDOMECHANICALS) ×4 IMPLANT
TROCAR XCEL NON-BLD 11X100MML (ENDOMECHANICALS) IMPLANT
TROCAR XCEL UNIV SLVE 11M 100M (ENDOMECHANICALS) IMPLANT
TUBE CALIBRATION LAPBAND (TUBING) ×4 IMPLANT
TUBING INSUFFLATION 10FT LAP (TUBING) ×4 IMPLANT

## 2013-06-21 NOTE — Progress Notes (Signed)
Utilization review completed.  

## 2013-06-21 NOTE — Transfer of Care (Signed)
Immediate Anesthesia Transfer of Care Note  Patient: Jocelyn FiddlerSharon K Martinez  Procedure(s) Performed: Procedure(s): LAPAROSCOPIC GASTRIC BANDING (N/A) MESH APPLIED TO LAP PORT  Patient Location: PACU  Anesthesia Type:General  Level of Consciousness: awake, patient cooperative and responds to stimulation  Airway & Oxygen Therapy: Patient Spontanous Breathing and Patient connected to face mask oxygen  Post-op Assessment: Report given to PACU RN, Post -op Vital signs reviewed and stable and Patient moving all extremities X 4  Post vital signs: stable  Complications: No apparent anesthesia complications

## 2013-06-21 NOTE — Op Note (Signed)
06/21/2013  Surgeon: Wenda LowMatt Chyane Greer, MD, FACS Asst:  Ovidio Kinavid Newman, MD, FACS  Procedure: Laparoscopic adjustable gastric banding with Lapband APS  Anes:  General  EBL:  Minimal  Description of Procedure  The patient was taken to OR # 1 and given general anesthesia.  After a prep with PCMX the patient was draped and a timeout performed.  Access to the abdomen was achieved with a 0 degree Optiview technique through the left upper quadrant.    Adhesions were minimal.  The liver was large and the left lateral segment was retracted satisfactorily.   Ports were placed to the the right of the midline including a 15 trocar in  the right upper quadrant placed obliquely.  The Satira Mccallumathanson was used to retract the left lateral segment and the peritoneum was incised along the left crus.   The EJ junction as assessed for a hiatus hernia and no dimple was seen.  A balloon test with 10 cc of air was negative.    The pars flaccida technique was utilized to insert the blunt "finger " dissector from right to left behind the stomach.  This created a target zone to pass the band passer.     The lapband APS  Had been previously flushed and was inserted through the 15 trocar.  It was placed in the tip of "the finger"  and pulled around behind the stomach.   The band was plicated with 3 sutures of 2-0 Surgidek and Ty Knots.  The tubing was brought out through the lower incision on the right and connected to the port which had mesh sewn onto the back and was placed into the subcutaneous pocket.  The incisions were injected with Exparel and closed with 4-0 vicryl and Dermabond.     The patient was taken to the PACU in stable condition.    Matt B. Daphine DeutscherMartin, MD, [facility], [place] [phone]

## 2013-06-21 NOTE — Interval H&P Note (Signed)
History and Physical Interval Note:  06/21/2013 7:20 AM  Jocelyn Martinez  has presented today for surgery, with the diagnosis of morbid obesity  The various methods of treatment have been discussed with the patient and family. After consideration of risks, benefits and other options for treatment, the patient has consented to  Procedure(s): LAPAROSCOPIC GASTRIC BANDING (N/A) as a surgical intervention .  The patient's history has been reviewed, patient examined, no change in status, stable for surgery.  I have reviewed the patient's chart and labs.  Questions were answered to the patient's satisfaction.     [name] B

## 2013-06-21 NOTE — Progress Notes (Signed)
Patient is alert and oriented, vital signs are stable, incision sites are within normal limits, patient up and ambulating around the nursing station several times this shift with light stand by assist, patient voiding, patient medicated for pain with 2 mg of morphine twice this shift and tolerated that well, patient currently on 2 liters of oxygen nasal cannula lung sounds are clear but diminished, no complaints of nausea or vomiting tolerating her 2 ounces of ice chips or water thus far Jocelyn Martinez, Jocelyn Walczyk N RN 06-21-2013 17:35pm

## 2013-06-21 NOTE — Anesthesia Postprocedure Evaluation (Signed)
  Anesthesia Post-op Note  Patient: Jocelyn Martinez  Procedure(s) Performed: Procedure(s) (LRB): LAPAROSCOPIC GASTRIC BANDING (N/A) MESH APPLIED TO LAP PORT  Patient Location: PACU  Anesthesia Type: General  Level of Consciousness: awake and alert   Airway and Oxygen Therapy: Patient Spontanous Breathing  Post-op Pain: mild  Post-op Assessment: Post-op Vital signs reviewed, Patient's Cardiovascular Status Stable, Respiratory Function Stable, Patent Airway and No signs of Nausea or vomiting  Last Vitals:  Filed Vitals:   06/21/13 1015  BP: 166/89  Pulse:   Temp:   Resp:     Post-op Vital Signs: stable   Complications: No apparent anesthesia complications
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
## 2013-06-22 ENCOUNTER — Observation Stay (HOSPITAL_COMMUNITY): Payer: Medicare Other

## 2013-06-22 ENCOUNTER — Encounter (HOSPITAL_COMMUNITY): Payer: Self-pay | Admitting: Surgery

## 2013-06-22 DIAGNOSIS — Z9884 Bariatric surgery status: Secondary | ICD-10-CM

## 2013-06-22 LAB — GLUCOSE, CAPILLARY
GLUCOSE-CAPILLARY: 132 mg/dL — AB (ref 70–99)
GLUCOSE-CAPILLARY: 104 mg/dL — AB (ref 70–99)
Glucose-Capillary: 146 mg/dL — ABNORMAL HIGH (ref 70–99)
Glucose-Capillary: 100 mg/dL — ABNORMAL HIGH (ref 70–99)

## 2013-06-22 LAB — CBC WITH DIFFERENTIAL/PLATELET
Basophils Absolute: 0 10*3/uL (ref 0.0–0.1)
Basophils Relative: 0 % (ref 0–1)
EOS ABS: 0 10*3/uL (ref 0.0–0.7)
EOS PCT: 0 % (ref 0–5)
HEMATOCRIT: 40.5 % (ref 36.0–46.0)
HEMOGLOBIN: 13.1 g/dL (ref 12.0–15.0)
LYMPHS PCT: 20 % (ref 12–46)
Lymphs Abs: 1.1 10*3/uL (ref 0.7–4.0)
MCH: 31.8 pg (ref 26.0–34.0)
MCHC: 32.3 g/dL (ref 30.0–36.0)
MCV: 98.3 fL (ref 78.0–100.0)
MONO ABS: 0.5 10*3/uL (ref 0.1–1.0)
MONOS PCT: 10 % (ref 3–12)
Neutro Abs: 3.8 10*3/uL (ref 1.7–7.7)
Neutrophils Relative %: 70 % (ref 43–77)
PLATELETS: 148 10*3/uL — AB (ref 150–400)
RBC: 4.12 MIL/uL (ref 3.87–5.11)
RDW: 12.9 % (ref 11.5–15.5)
WBC: 5.5 10*3/uL (ref 4.0–10.5)

## 2013-06-22 NOTE — Discharge Instructions (Signed)
Laparoscopic Gastric Band Surgery Care After These instructions give you information on caring for yourself after your procedure. Your doctor may also give you more specific instructions. Call your doctor if you have any problems or questions after your procedure. HOME CARE   Take walks throughout the day. Do not sit for longer than 1 hour while awake for 4 to 6 weeks.  You may shower 2 days after surgery. Pat the surgery cuts (incisions) dry. Do not rub the surgery cuts.  Do your coughing and deep breathing exercises.  Do not lift, push, or pull anything heavy until your doctor says it is okay.  Only take medicines as told by your doctor. Do not drive while taking pain medicine.  Drink plenty of fluids to keep your pee (urine) clear or pale yellow.  Stay on a liquid diet as long as your doctor tells you to.  Do not drink caffeine for 1 month.  Change bandages (dressings) as told by your doctor.  Check your surgery cuts for redness, pufffiness (swelling), abnormal coloring, fluid, or bleeding.  Follow your doctor's advice about vitamin and protein needs after surgery. GET HELP RIGHT AWAY IF:  You feel sick to your stomach (nauseous) and throw up (vomit).  You have pain and discomfort with swallowing.  You develop shortness of breath or difficulty breathing.  You have pain, puffiness, or feel warmth on your lower body.  You have very bad calf pain or pain not relieved by medicine.  You have a temperature by mouth above 102 F (38.9 C).  Your surgery cuts look red, puffy, or they leak fluid.  Your poop (stool) is black, tarry, or dark red.  You have chills.  You have chest pain.  You feel confused.  You have slurred speech.  You feel lightheaded when standing.  You suddenly feel weak.  You have any questions or concerns. MAKE SURE YOU:   Understand these instructions.  Will watch your condition.  Will get help right away if you are not doing well or get  worse. Document Released: 04/12/2010 Document Revised: 07/05/2012 Document Reviewed: 04/12/2010 [facility] Patient Information 2014 [place], [place].                    ADJUSTABLE GASTRIC BAND  Home Care Instructions   These instructions are to help you care for yourself when you go home.  Call: If you have any problems. Call [phone] and ask for the surgeon on call If you need immediate assistance come to the ER at [facility]. Tell the ER staff you are a new post-op gastric banding patient  Signs and symptoms to report: Severe  vomiting or nausea If you cannot handle clear liquids for longer than 1 day, call your surgeon Abdominal pain which does not get better after taking your pain medication Fever greater than 100.4  F and chills Heart rate over 100 beats a minute Trouble breathing Chest pain Redness,  swelling, drainage, or foul odor at incision (surgical) sites If your incisions open or pull apart Swelling or pain in calf (lower leg) Diarrhea (Loose bowel movements that happen often), frequent watery, uncontrolled bowel movements Constipation, (no bowel movements for 3 days) if this happens: Take Milk of Magnesia, 2 tablespoons by mouth, 3 times a day for 2 days if needed Stop taking Milk of Magnesia once you have had a bowel movement Call your doctor if constipation continues Or Take Miralax  (instead of Milk of Magnesia) following the label instructions Stop  taking Miralax once you have had a bowel movement °Call your doctor if constipation continues °Anything you think is “abnormal for you” °  °Normal side effects after surgery: Unable to sleep at night or unable to concentrate °Irritability °Being tearful (crying) or depressed ° °These are common complaints, possibly related to your anesthesia, stress of surgery, and change in lifestyle, that usually go away a few weeks after surgery. If these feelings continue, call your medical doctor.  °Wound Care: You may have  surgical glue, steri-strips, or staples over your incisions after surgery °Surgical glue: Looks like clear film over your incisions and will wear off a little at a time °Steri-strips: Adhesive strips of tape over your incisions. You may notice a yellowish color on skin under the steri-strips. This is used to make the steri-strips stick better. Do not pull the steri-strips off - let them fall off °Staples: Staples may be removed before you leave the hospital °If you go home with staples, call [facility] for an appointment with your surgeon’s nurse to have staples removed 10 days after surgery, [phone] °Showering: You may shower two (2) days after your surgery unless your surgeon tells you differently °Wash gently around incisions with warm soapy water, rinse well, and gently pat dry °If you have a drain (tube from your incision), you may need someone to hold this while you shower °No tub baths until staples are removed and incisions are healed °  °Medications: Medications should be liquid or crushed if larger than the size of a dime °Extended release pills (medication that releases a little bit at a time through the  day) should not be crushed °Depending on the size and number of medications you take, you may need to space (take a few throughout the day)/change the time you take your medications so that you do not over-fill your pouch (smaller stomach) °Make sure you follow-up with you primary care physician to make medication changes needed during rapid weight loss and life -style changes °If you have diabetes, follow up with your doctor that orders your diabetes medication(s) within one week after surgery and check your blood sugar regularly ° °Do not drive while taking narcotics (pain medications) ° °Do not take acetaminophen (Tylenol) and Roxicet or Lortab Elixir at the same time since these pain medications contain acetaminophen °  °Diet:  °First 2 Weeks You will see the nutritionist about  two (2) weeks after your surgery. The nutritionist will increase the types of foods you can eat if you are handling liquids well: °If you have severe vomiting or nausea and cannot handle clear liquids lasting longer than 1 day call your surgeon °For Same Day Surgery Discharge Patients: °The day of surgery drink water only: 2 ounces every 4 hours °If you are handling water, start drinking your high protein shake the next morning °For Overnight Stay Patients: °Begin by drinking 2 ounces of a high protein every 3 hours, 5-6 times per day °Slowly increase the amount you drink as tolerated °You may find it easier to slowly sip shakes throughout the day. It is important to get your proteins in first °  ° Protein Shake °Drink at least 2 ounces of shake 5-6 times per day °Each serving of protein shakes (usually 8-12 ounces) should have a minimum of: °15 grams of protein °And no more than 5 grams of carbohydrate °Goal for protein each day: °Men = 80 grams per day °Women = 60 grams per day °Protein powder may be   added to fluids such as non-fat milk or Lactaid milk or Soy milk (limit to 35 grams added protein powder per serving)  Hydration Slowly increase the amount of water and other clear liquids as tolerated (See Acceptable Fluids) Slowly increase the amount of protein shake as tolerated Sip fluids slowly and throughout the day May use sugar substitutes in small amounts (no more than 6-8 packets per day; i.e. Splenda)  Fluid Goal The first goal is to drink at least 8 ounces of protein shake/drink per day (or as directed by the nutritionist); some examples of protein shakes are Syntrax, Nectar, [employer], EAS Edge HP, and Unjury. - See handout from pre-op Bariatric Education Class: Slowly increase the amount of protein shake you drink as tolerated You may find it easier to slowly sip shakes throughout the day It is important to get your proteins in first Your fluid goal is to drink 64-100 ounces of fluid  daily It may take a few weeks to build up to this  32 oz. (or more) should be full liquids (see below for examples) Liquids should not contain sugar, caffeine, or carbonation  Clear Liquids: Water of Sugar-free flavored water (i.e. Fruit HO, Propel) Decaffeinated coffee or tea (sugar-free) Crystal lite, Wylers Lite, Minute Maid Lite Sugar-free Jell-O Bouillon or broth Sugar-free Popsicle:    - Less than 20 calories each; Limit 1 per day        Full Liquids:                   Protein Shakes/Drinks + 2 choices per day of other full liquids Full liquids must be: No More Than 12 grams of Carbs per serving No More Than 3 grams of Fat per serving Strained low-fat cream soup Non-Fat milk Fat-free Lactaid Milk Sugar-free yogurt (Dannon Lite & Fit, Greek yogurt)   Vitamins and Minerals Start 1 day after surgery unless otherwise directed by your surgeon 1 Chewable Multivitamin / Multimineral Supplement with iron (i.e. Centrum for Adults) Chewable Calcium Citrate with Vitamin D-3 (Example: 3 Chewable Calcium  Plus 600 with vitamin D-3) Take 500 mg three (3) times a day for a total of 1500 mg per day Do not take all 3 doses of calcium at one time as it may cause constipation, and you can only absorb 500 mg at a time Do not mix multivitamins containing iron with calcium supplements;  take 2 hours apart Do not substitute Tums (calcium carbonate) for your calcium Menstruating women and those at risk for anemia ( a blood disease that causes weakness) may need extra iron Talk to your doctor to see if you need more iron If you need extra iron: total daily iron recommendation (including Vitamins) is 50 to 100 mg Iron/day Do not stop taking or change any vitamins or minerals until you talk to your nutritionist or surgeon Your nutritionist and/or surgeon must approve all vitamin and mineral supplements  Activity and Exercise: It is important to continue walking at home. Limit your physical  activity as instructed by your doctor. During this time, use these guidelines: Do not lift anything greater than ten  (10) pounds for at least two (2) weeks Do not go back to work or drive until Engineer, production says you can You may have sex when you feel comfortable It is VERY important for female patients to use a reliable birth control method; fertility often increase after surgery Do not get pregnant for at least 18 months Start exercising as soon as your  doctor tells you that you can Make sure your doctor approves any physical activity Start with a simple walking program Walk 5-15 minutes each day, 7 days per week Slowly increase until you are walking 30-45 minutes per day Consider joining our [place] program. [phone] or email [email or web address]    Special Instructions  Things to remember: Free counseling is available for you and your family through collaboration between [facility] and [place]. Please call [phone] and leave a message Use your CPAP when sleeping if this applies to you Consider buying a medical alert bracelet that says you had lap-band surgery You will likely have your first fill (fluid added to your band) 6 - 8 weeks after surgery [facility] has a free Bariatric Surgery Support Group that meets monthly, the 3rd Thursday, [place]. You can see classes online at [email or web address] It is very important to keep all follow up appointments with your surgeon, nutritionist, primary care physician, and behavioral health practitioner After the first year, please follow up with your bariatric surgeon and nutritionist at least once a year in order to maintain best weight loss results                    [facility]:  [place]: [phone]               Bariatric Nurse Coordinator: [phone]      Adjustable Gastric Band Home Care  Instructions  Rev. 04/2012                                                                  Reviewed and Endorsed                                                   by [facility] Patient Education Committee, Jan, 2014

## 2013-06-22 NOTE — Progress Notes (Signed)
Nutrition Education Note  Patient identified per DROP protocol initiative.   Body mass index is 52.81 kg/(m^2). Patient meets criteria for morbid obesity based on current BMI.   Discussed 2 week post-op diet plan, diet progression reviewed. Pt to start next phase of diet after meeting with outpatient RD at 2 weeks post-op. Pt c/o nausea at around 6:30am but this is improving. Diet education questions answered, pt expressed understanding, expect good compliance.   Diet: First 2 Weeks  You will see the nutritionist about two (2) weeks after your surgery. The nutritionist will increase the types of foods you can eat if you are handling liquids well:  If you have severe vomiting or nausea and cannot handle clear liquids lasting longer than 1 day, call your surgeon  Protein Shake  Drink at least 2 ounces of shake 5-6 times per day  Each serving of protein shakes (usually 8 - 12 ounces) should have a minimum of:  15 grams of protein  And no more than 5 grams of carbohydrate  Goal for protein each day:  Men = 80 grams per day  Women = 60 grams per day  Protein powder may be added to fluids such as non-fat milk or Lactaid milk or Soy milk (limit to 35 grams added protein powder per serving)   Hydration  Slowly increase the amount of water and other clear liquids as tolerated (See Acceptable Fluids)  Slowly increase the amount of protein shake as tolerated  Sip fluids slowly and throughout the day  May use sugar substitutes in small amounts (no more than 6 - 8 packets per day; i.e. Splenda)   Fluid Goal  The first goal is to drink at least 8 ounces of protein shake/drink per day (or as directed by the nutritionist); some examples of protein shakes are ITT IndustriesSyntrax Nectar, Dillard'sdkins Advantage, EAS Edge HP, and Unjury. See handout from pre-op Bariatric Education Class:  Slowly increase the amount of protein shake you drink as tolerated  You may find it easier to slowly sip shakes throughout the day  It  is important to get your proteins in first  Your fluid goal is to drink 64 - 100 ounces of fluid daily  It may take a few weeks to build up to this  32 oz (or more) should be clear liquids  And  32 oz (or more) should be full liquids (see below for examples)  Liquids should not contain sugar, caffeine, or carbonation   Clear Liquids:  Water or Sugar-free flavored water (i.e. Fruit H2O, Propel)  Decaffeinated coffee or tea (sugar-free)  Crystal Lite, Wyler's Lite, Minute Maid Lite  Sugar-free Jell-O  Bouillon or broth  Sugar-free Popsicle: *Less than 20 calories each; Limit 1 per day   Full Liquids:  Protein Shakes/Drinks + 2 choices per day of other full liquids  Full liquids must be:  No More Than 12 grams of Carbs per serving  No More Than 3 grams of Fat per serving  Strained low-fat cream soup  Non-Fat milk  Fat-free Lactaid Milk  Sugar-free yogurt (Dannon Lite & Fit, Greek yogurt)          PurdyHeather Baron MS, RD, LDN [phone] Pager [phone] After Hours Pager

## 2013-06-22 NOTE — Discharge Summary (Signed)
Physician Discharge Summary  Patient ID: Jocelyn Martinez MRN: [id number] DOB/AGE: 10/04/1945 68 y.o.  Admit date: 06/21/2013 Discharge date: 06/22/2013  Admission Diagnoses:  Morbid obesity  Discharge Diagnoses:  same  Active Problems:   Morbid obesity   Lapband APS March 2015   Surgery:  Lapband APS  Discharged Condition: improved  Hospital Course:   Had surgery.  Xray : The gastric band shows normal orientation spanning from roughly the  2:00 to 8:00 positions on a clock face. Attached tubing and  appearance of the subcutaneous port are unremarkable. Visualized  bowel gas shows no evidence of obstruction or ileus. No soft tissue  abnormalities or abnormal calcifications are seen.   Consults: none  Significant Diagnostic Studies: The gastric band shows normal orientation spanning from roughly the  2:00 to 8:00 positions on a clock face. Attached tubing and  appearance of the subcutaneous port are unremarkable. Visualized  bowel gas shows no evidence of obstruction or ileus. No soft tissue  abnormalities or abnormal calcifications are seen.     Discharge Exam: Blood pressure 124/80, pulse 81, temperature 97.8 F (36.6 C), temperature source Oral, resp. rate 18, height 5\' 2"  (1.575 m), weight 288 lb 12.8 oz (130.999 kg), SpO2 94.00%. Incisions OK but sore.    Disposition: 01-Home or Self Care  Discharge Orders   Future Appointments Provider Department Dept Phone   07/05/2013 3:30 PM [facility] [phone]   07/13/2013 2:00 PM Valarie Merino, MD [facility], [place] [phone]   Future Orders Complete By Expires   Diet - low sodium heart healthy  As directed    Discharge instructions  As directed    Comments:     Follow bariatric dietary instructions   Discharge wound care:  As directed    Comments:     May shower and get incisions wet   Increase activity slowly  As directed         Medication List         b complex vitamins tablet  Take 1 tablet by mouth daily.     Biotin 1000 MCG tablet  Take 1,000 mcg by mouth 3 (three) times daily.     calcium carbonate 600 MG Tabs tablet  Commonly known as:  OS-CAL  Take 600 mg by mouth 2 (two) times daily with a meal.     divalproex 500 MG DR tablet  Commonly known as:  DEPAKOTE  Take 1,000-1,500 mg by mouth daily. Take 2 tabs on even days of month and takes 3 tabs on odd days of month     DULoxetine 60 MG capsule  Commonly known as:  CYMBALTA  Take 60 mg by mouth every morning.     HYDROcodone-acetaminophen 7.5-325 mg/15 ml solution  Commonly known as:  HYCET  Take 10 mLs by mouth 4 (four) times daily as needed for moderate pain.     JANUVIA 100 MG tablet  Generic drug:  sitaGLIPtin  Take 100 mg by mouth daily.     levothyroxine 150 MCG tablet  Commonly known as:  SYNTHROID, LEVOTHROID  Take 150 mcg by mouth daily before breakfast.     lisinopril 20 MG tablet  Commonly known as:  PRINIVIL,ZESTRIL  Take 20 mg by mouth daily.     Melatonin 3 MG Tabs  Take 3 mg by mouth at bedtime.     meloxicam 15 MG tablet  Commonly known as:  MOBIC  Take 15 mg by  mouth daily.     multivitamin with minerals Tabs tablet  Take 1 tablet by mouth daily.     pravastatin 40 MG tablet  Commonly known as:  PRAVACHOL  Take 40 mg by mouth at bedtime.     pregabalin 150 MG capsule  Commonly known as:  LYRICA  Take 150 mg by mouth 2 (two) times daily.     SANCTURA PO  Take 60 mg by mouth daily.     solifenacin 10 MG tablet  Commonly known as:  VESICARE  Take 10 mg by mouth daily.     TRADJENTA 5 MG Tabs tablet  Generic drug:  linagliptin  Take 5 mg by mouth daily.     Vitamin D 2000 UNITS tablet  Take 2,000 Units by mouth daily.     zolpidem 10 MG tablet  Commonly known as:  AMBIEN  Take 10 mg by mouth at bedtime as needed for sleep.           Follow-up Information   Follow up with Valarie MerinoMARTIN,Remigio Mcmillon B, MD.    Specialty:  General Surgery   Contact information:   [address] [place] [address] [phone]       Signed: Valarie MerinoMARTIN,Adriana Lina B 06/22/2013, 9:08 AM

## 2013-06-22 NOTE — Progress Notes (Signed)
Patient is alert and oriented.  Pain is controlled, and patient is tolerating fluids.  Advanced to protein shake today.  Reviewed Adjustable gastric band discharge instructions with patient, patient able to articulate understanding.  Provided information on BELT program, Support Group and [facility] outpatient pharmacy. All questions answered, will continue to monitor.  

## 2013-06-23 ENCOUNTER — Telehealth (HOSPITAL_COMMUNITY): Payer: Self-pay

## 2013-06-23 NOTE — Telephone Encounter (Signed)
Made discharge phone call to patient per DROP protocol. Asking the following questions.    1. Do you have someone to care for you now that you are home?  yes 2. Are you having pain now that is not relieved by your pain medication? No   3. Are you able to drink the recommended daily amount of fluids (48 ounces minimum/day) and protein (60-80 grams/day) as prescribed by the dietitian or nutritional counselor?  Working up to it 4. Are you taking the vitamins and minerals as prescribed?  Not yet, but starting tomorrow 5. Do you have the "on call" number to contact your surgeon if you have a problem or question?  yes 6. Are your incisions free of redness, swelling or drainage? (If steri strips, address that these can fall off, shower as tolerated) no 7. Have your bowels moved since your surgery?  No If not, are you passing gas?  yes 8. Are you up and walking 3-4 times per day?  yes 9. Do you have an appointment to see a dietitian or nutritional counselor in the next month? yes   The following questions can be added at the center's discharge phone call script at the center's discretion.  The questions are also captured within D.R[employer]P. project custom fields. 1. Do you have an appointment made to see your surgeon in the next month?  yes 2. Were you provided your discharge medications before your surgery or before you were discharged from the hospital and are you taking them without problem?  yes 3. Were you provided phone numbers to the clinic/surgeon's office?  yes 4. Did you watch the patient education video module in the (clinic, surgeon's office, etc.) before your surgery? yes 5. Do you have a discharge checklist that was provided to you in the hospital to reference with instructions on how to take care of yourself after surgery?  yes 6. Did you see a dietitian or nutritional counselor while you were in the hospital?  yes

## 2013-07-05 ENCOUNTER — Encounter: Payer: Medicare Other | Attending: Surgery

## 2013-07-05 DIAGNOSIS — Z713 Dietary counseling and surveillance: Secondary | ICD-10-CM | POA: Insufficient documentation

## 2013-07-05 NOTE — Progress Notes (Signed)
Bariatric Class:  Appt start time: 1530 end time:  1630.  2 Week Post-Operative Nutrition Class  Patient was seen on 07/05/2013 for Post-[facility]ative [facility]   Surgery date: 06/21/2013 Surgery type: LAGB Start weight at [facility]: 290 lbs Weight today:275.5 lbs Weight change: 14.5 lbs   TANITA  BODY COMP RESULTS  07/05/13   BMI (kg/m^2) 50.4   Fat Mass (lbs) 158.0   Fat Free Mass (lbs) 117.5   Total Body Water (lbs) 86.0   The following the learning objectives were met by the patient during this course:  Identifies Phase 3A (Soft, High Proteins) Dietary Goals and will begin from 2 weeks post-operatively to 2 months post-operatively  Identifies appropriate sources of fluids and proteins   States protein recommendations and appropriate sources post-operatively  Identifies the need for appropriate texture modifications, mastication, and bite sizes when consuming solids  Identifies appropriate multivitamin and calcium sources post-operatively  Describes the need for physical activity post-operatively and will follow MD recommendations  States when to call healthcare provider regarding medication questions or post-operative complications  Handouts given during class include:  Phase 3A: Soft, High Protein Diet Handout  Band Fill Guidelines Handout  Follow-Up Plan: Patient will follow-up at [facility] in 4 weeks for 6 week post-op nutrition visit for diet advancement per MD.

## 2013-07-05 NOTE — Patient Instructions (Signed)
Patient to follow Phase 3A-Soft, High Protein Diet and follow-up at [facility] in 6 weeks for 2 months post-op nutrition visit for diet advancement. 

## 2013-07-13 ENCOUNTER — Ambulatory Visit (INDEPENDENT_AMBULATORY_CARE_PROVIDER_SITE_OTHER): Payer: Medicare Other | Admitting: Surgery

## 2013-07-13 ENCOUNTER — Encounter (INDEPENDENT_AMBULATORY_CARE_PROVIDER_SITE_OTHER): Payer: Self-pay | Admitting: Surgery

## 2013-07-13 NOTE — Progress Notes (Signed)
Lapband Fill Encounter Problem List:   Patient Active Problem List   Diagnosis Date Noted  . 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 Diabetes 01/07/2013  . Fibromyalgia 01/07/2013  . H/O Hashimoto thyroiditis 01/07/2013  . Essential hypertension, benign 01/07/2013  . Urinary incontinence 01/07/2013  . Morbid obesity 01/07/2013    Lorita OfficerSharon K Allis Body mass index is 50.36 kg/(m^2). Weight loss since surgery  15 lbs  Having regurgitation?:  no  Feel that they need a fill?  Only 4 weeks out  Nocturnal reflux?  no  Amount of fill  0     Instructions given and weight loss goals discussed.    Incisions OK.  Has had some right breast pain that comes and goes and is not related to breathing.  Lungs are clear.  Will see back in 2 weeks for her first lapband fill.    Matt B. Daphine DeutscherMartin, MD, FACS

## 2013-07-13 NOTE — Patient Instructions (Signed)
  These apply after your first band fill which will be on your next visit.  1. Stay on liquids for the next 2 days as you adapt to your new fill volume.  Then resume your previous diet. 2. Decreasing your carbohydrate intake will hasten your weight loss.  Rely more on proteins for your meals.  Avoid condiments that contain sweets such as Honey Mustard and sugary salad dressings.   3. Stay in the "green zone".  If you are regurgitating with meals, having night time reflux, and find yourself eating soft comfort foods (mashed potatoes, potato chips)...realize that you are developing "maladaptive eating".  You will not lose weight this way and may regain weight.  The GREEN ZONE is eating smaller portions and not regurgitating.  Hence we may need to withdraw fluid from your band. 4. Build exercise into your daily routine.  Walking is the best way to start but do something every day if you can.

## 2013-08-05 ENCOUNTER — Encounter: Payer: Medicare Other | Attending: Surgery | Admitting: Dietician

## 2013-08-05 DIAGNOSIS — Z713 Dietary counseling and surveillance: Secondary | ICD-10-CM | POA: Insufficient documentation

## 2013-08-05 NOTE — Progress Notes (Signed)
  Follow-up visit:  6 Weeks Post-Operative LAGB Surgery  Medical Nutrition Therapy:  Appt start time: 1030 end time:  1100.  Primary concerns today: Post-operative Bariatric Surgery Nutrition Management. Jocelyn Martinez returns today reporting she is doing well but feeling hungry, feels like she needs a fill. She says she is trying not to overeat. She says she is "not eating anything I'm not supposed to." Having protein shakes, meat, and squash, zucchini, and green beans.  Surgery date: 06/21/2013 Surgery type: LAGB [facility] Weight today:265 lbs Weight change: 10.5 lbs Total weight loss: 25 lbs   TANITA  BODY COMP RESULTS  07/05/13 08/05/13   BMI (kg/m^2) 50.4 48.5   Fat Mass (lbs) 158.0 152   Fat Free Mass (lbs) 117.5 113   Total Body Water (lbs) 86.0 82.5    Preferred Learning Style:   No preference indicated   Learning Readiness:   Ready  24-hr recall: B (AM): decaf coffee and EAS shake (17g) Snk (AM): 2 scrambled eggs (12g) L (PM): EAS shake (17g) Snk ( PM): 2.5 oz pack of tuna (16g) D ( PM): 3oz lean pork loin or chicken or fish (21 g)  Fluid intake: 48-64 oz per patient estimate (has overactive bladder) Estimated total protein intake: ~83g  Medications: see list; stopped depakote Supplementation: taking  CBG monitoring: inconsistent Average CBG per patient: unknown Last patient reported A1c: no new A1c  Using straws: no Drinking while eating: once in a while if needed Hair loss: yes, taking Biotin and Rogaine Carbonated beverages: no N/V/D/C: none Last Lap-Band fill: none yet  Recent physical activity:  Walking; plans to start water aerobics  Progress Towards Goal(s):  In progress.  Handouts given during visit include:  Phase 3B protein + non-starchy vegetables   Nutritional Diagnosis:   [place]-3.3 Overweight/obesity related to past poor dietary habits and physical inactivity as evidenced by patient w/ recent LAGB surgery following dietary  guidelines for continued weight loss.  Intervention:  Nutrition counseling provided.  Teaching Method Utilized:  Visual Auditory  Barriers to learning/adherence to lifestyle change: none  Demonstrated degree of understanding via:  Teach Back   Monitoring/Evaluation:  Dietary intake, exercise, lap band fills, and body weight. Follow up in 1.5 months for 3 month post-op visit.

## 2013-08-05 NOTE — Patient Instructions (Addendum)
-  Check blood sugars more consistently -Talk to new PCP about medications -Work on establishing exercise routine -Increase fluid intake when overactive bladder medication is managed   TANITA  BODY COMP RESULTS  07/05/13 08/05/13   BMI (kg/m^2) 50.4 48.5   Fat Mass (lbs) 158.0 152   Fat Free Mass (lbs) 117.5 113   Total Body Water (lbs) 86.0 82.5

## 2013-08-11 ENCOUNTER — Encounter (INDEPENDENT_AMBULATORY_CARE_PROVIDER_SITE_OTHER): Payer: Medicare Other | Admitting: Surgery

## 2013-08-25 ENCOUNTER — Encounter (INDEPENDENT_AMBULATORY_CARE_PROVIDER_SITE_OTHER): Payer: Self-pay | Admitting: Surgery

## 2013-08-25 ENCOUNTER — Ambulatory Visit (INDEPENDENT_AMBULATORY_CARE_PROVIDER_SITE_OTHER): Payer: Medicare Other | Admitting: Surgery

## 2013-08-25 VITALS — BP 130/70 | HR 106 | Ht 62.0 in | Wt 262.0 lb

## 2013-08-25 DIAGNOSIS — Z4651 Encounter for fitting and adjustment of gastric lap band: Secondary | ICD-10-CM

## 2013-08-25 NOTE — Patient Instructions (Signed)

## 2013-08-25 NOTE — Progress Notes (Signed)
Lapband Fill Encounter Problem List:   Patient Active Problem List   Diagnosis Date Noted  . 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 Diabetes 01/07/2013  . Fibromyalgia 01/07/2013  . H/O Hashimoto thyroiditis 01/07/2013  . Essential hypertension, benign 01/07/2013  . Urinary incontinence 01/07/2013  . Morbid obesity 01/07/2013    Lorita Officer Rea Body mass index is 47.91 kg/(m^2). Weight loss since surgery  28.9  Having regurgitation?:  no  Feel that they need a fill?  yes  Nocturnal reflux?  no  Amount of fill  1     Instructions given and weight loss goals discussed.    Doing well thus far.    Matt B. Daphine Deutscher, MD, FACS

## 2013-09-19 ENCOUNTER — Encounter: Payer: Medicare Other | Attending: Surgery | Admitting: Dietician

## 2013-09-19 DIAGNOSIS — E119 Type 2 diabetes mellitus without complications: Secondary | ICD-10-CM | POA: Diagnosis not present

## 2013-09-19 DIAGNOSIS — Z713 Dietary counseling and surveillance: Secondary | ICD-10-CM | POA: Insufficient documentation

## 2013-09-19 NOTE — Progress Notes (Signed)
  Follow-up visit:  6 Weeks Post-Operative LAGB Surgery  Medical Nutrition Therapy:  Appt start time: 1045 end time:  1115.  Primary concerns today: Post-operative Bariatric Surgery Nutrition Management.  Jocelyn DecemberSharon returns today having lost 10 pounds since her last visit. She reports that she vomited 3x last week from trying to "wash down" leftover pork chops that had gotten "stuck." She also feels that her hiatal hernia may be contributing to stuck feeling after eating certain foods. She reports that she can eat softer meats (fish, ground beef, and eggs) and salad with no issue. Chicken and pork cause the most difficulty, despite chewing thoroughly.    Surgery date: 06/21/2013 Surgery type: LAGB [facility] Weight today: 255 lbs Weight change: 10 lbs Total weight loss: 35 lbs   TANITA  BODY COMP RESULTS  07/05/13 08/05/13 09/19/13   BMI (kg/m^2) 50.4 48.5 46.6   Fat Mass (lbs) 158.0 152 140.5   Fat Free Mass (lbs) 117.5 113 114.5   Total Body Water (lbs) 86.0 82.5 84    Preferred Learning Style:   No preference indicated   Learning Readiness:   Ready  24-hr recall: B (AM): 2 eggs and cheese (14g) Snk (AM): shake (17g)  L (PM): salad with chicken or tuna (7g) Snk ( PM): shake (17g) D ( PM): 3oz lean pork loin or chicken or fish and vegetable (21 g) Snk (PM): usually none sometimes yogurt or jello  Fluid intake: 70+ oz of water with Sallye OberKool Aid SF packet and shakes Estimated total protein intake: ~76g per day  Medications: see list Supplementation: taking  CBG monitoring: inconsistent Average CBG per patient: unknown Last patient reported A1c: no new A1c  Using straws: no Drinking while eating: once in a while if needed, causes vomiting sometimes Hair loss: yes, taking Biotin and Rogaine Carbonated beverages: no N/V/D/C: vomited 3x last week Last Lap-Band fill: 1cc on 08/25/13  Recent physical activity:  "trying to walk some"  Progress Towards Goal(s):   In progress.   Nutritional Diagnosis:  [place]-3.3 Overweight/obesity related to past poor dietary habits and physical inactivity as evidenced by patient w/ recent LAGB surgery following dietary guidelines for continued weight loss.  Intervention:  Nutrition counseling provided.  Teaching Method Utilized:  Visual Auditory  Barriers to learning/adherence to lifestyle change: none  Demonstrated degree of understanding via:  Teach Back   Monitoring/Evaluation:  Dietary intake, exercise, lap band fills, and body weight. Follow up in 3 months for 6 month post-op visit.

## 2013-09-19 NOTE — Patient Instructions (Addendum)
-  Limit carbs to 15g per meal  -Have protein foods first! -Keep chewing really well and avoid tough meats  -Add broth or low fat cream soup  -Use crockpot; avoid reheating meats   TANITA  BODY COMP RESULTS  07/05/13 08/05/13 09/19/13   BMI (kg/m^2) 50.4 48.5 46.6   Fat Mass (lbs) 158.0 152 140.5   Fat Free Mass (lbs) 117.5 113 114.5   Total Body Water (lbs) 86.0 82.5 84

## 2013-10-14 ENCOUNTER — Encounter (INDEPENDENT_AMBULATORY_CARE_PROVIDER_SITE_OTHER): Payer: Medicare Other | Admitting: Surgery

## 2013-11-10 ENCOUNTER — Ambulatory Visit (INDEPENDENT_AMBULATORY_CARE_PROVIDER_SITE_OTHER): Payer: Medicare Other | Admitting: Surgery

## 2013-11-10 VITALS — BP 130/78 | HR 75 | Temp 96.7°F | Ht 62.0 in | Wt 239.2 lb

## 2013-11-10 DIAGNOSIS — Z4651 Encounter for fitting and adjustment of gastric lap band: Secondary | ICD-10-CM

## 2013-11-10 NOTE — Progress Notes (Signed)
Lapband Fill Encounter Problem List:   Patient Active Problem List   Diagnosis Date Noted  . 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 Diabetes 01/07/2013  . Fibromyalgia 01/07/2013  . H/O Hashimoto thyroiditis 01/07/2013  . Essential hypertension, benign 01/07/2013  . Urinary incontinence 01/07/2013  . Morbid obesity 01/07/2013    Lorita OfficerSharon K Blaisdell Body mass index is 43.75 kg/(m^2). Weight loss since surgery  51 lbs  Having regurgitation?:  Some foamy   Feel that they need a fill?  I don't think so  Nocturnal reflux?  no  Amount of fill  0     Instructions given and weight loss goals discussed.    She has lost 22 lbs since last visit and DM is well treated.   Will not fill today but will reassess in 8 weeks.  Matt B. Daphine DeutscherMartin, MD, FACS

## 2013-12-19 ENCOUNTER — Encounter: Payer: Medicare Other | Attending: Surgery | Admitting: Dietician

## 2013-12-19 DIAGNOSIS — Z713 Dietary counseling and surveillance: Secondary | ICD-10-CM | POA: Insufficient documentation

## 2013-12-19 DIAGNOSIS — Z9884 Bariatric surgery status: Secondary | ICD-10-CM | POA: Diagnosis not present

## 2013-12-19 DIAGNOSIS — Z6841 Body Mass Index (BMI) 40.0 and over, adult: Secondary | ICD-10-CM | POA: Diagnosis not present

## 2013-12-19 NOTE — Patient Instructions (Addendum)
-  Keep working on eating slowly and chewing thoroughly  TANITA  BODY COMP RESULTS  07/05/13 08/05/13 09/19/13 12/19/13   BMI (kg/m^2) 50.4 48.5 46.6 42.4   Fat Mass (lbs) 158.0 152 140.5 122.5   Fat Free Mass (lbs) 117.5 113 114.5 109.5   Total Body Water (lbs) 86.0 82.5 84 80

## 2013-12-19 NOTE — Progress Notes (Signed)
  Follow-up visit:  6 Weeks Post-Operative LAGB Surgery  Medical Nutrition Therapy:  Appt start time: 950 end time:  1025  Primary concerns today: Post-operative Bariatric Surgery Nutrition Management.  Jocelyn Martinez returns today having lost 23 pounds since her last visit 3 months ago. Feels like band is a little tight, however she is meeting her protein and fluid needs. Having a lot of gas pain in her ribs and sometimes feels like food is getting stuck. Feels like maybe it's because she sometimes eats too fast. "Vomiting solves the gas problem." Jocelyn Martinez reports that when she vomits, it is usually "foamy saliva."  Surgery date: 06/21/2013 Surgery type: LAGB Start weight at [facility]: 290 lbs Weight today: 232 lbs Weight change: 23 lbs Total weight loss: 58 lbs   TANITA  BODY COMP RESULTS  07/05/13 08/05/13 09/19/13 12/19/13   BMI (kg/m^2) 50.4 48.5 46.6 42.4   Fat Mass (lbs) 158.0 152 140.5 122.5   Fat Free Mass (lbs) 117.5 113 114.5 109.5   Total Body Water (lbs) 86.0 82.5 84 80    Preferred Learning Style:   No preference indicated   Learning Readiness:   Ready  24-hr recall: B (AM): 2 eggs and cheese or 2 Dannon Light and Fit yogurts (14-24g) Snk (AM): shake (17g)  L (PM): salad with chicken or hamburger or low sodium deli meat (7g) Snk ( PM): shake (17g) D ( PM): 3oz lean pork loin or chicken or fish and vegetable (21 g) Snk (PM): usually none sometimes yogurt or jello  Fluid intake: 64-96 oz of water with [name] Aid SF packet, decaf unsweet tea, and shakes Estimated total protein intake: 70+g per day  Medications: see list Supplementation: taking  CBG monitoring: inconsistent Average CBG per patient: "no spikes" Last patient reported A1c: 5.8% (May or June)  Using straws: no Drinking while eating: no Hair loss: no longer having hair loss Carbonated beverages: no N/V/D/C: vomiting about every other day Last Lap-Band fill: none recently; last fill 1cc on 08/25/13  Recent  physical activity:  Still unpacking/moving into a new house  Progress Towards Goal(s):  In progress.   Nutritional Diagnosis:  [place]-3.3 Overweight/obesity related to past poor dietary habits and physical inactivity as evidenced by patient w/ recent LAGB surgery following dietary guidelines for continued weight loss.  Intervention:  Nutrition counseling provided.  Teaching Method Utilized:  Visual Auditory  Barriers to learning/adherence to lifestyle change: none  Demonstrated degree of understanding via:  Teach Back   Monitoring/Evaluation:  Dietary intake, exercise, lap band fills, and body weight. Follow up in 3 months for 9 month post-op visit.

## 2014-01-05 ENCOUNTER — Encounter (INDEPENDENT_AMBULATORY_CARE_PROVIDER_SITE_OTHER): Payer: Medicare Other | Admitting: Surgery

## 2014-03-20 ENCOUNTER — Encounter: Payer: Medicare Other | Attending: Surgery | Admitting: Dietician

## 2014-03-20 DIAGNOSIS — Z6841 Body Mass Index (BMI) 40.0 and over, adult: Secondary | ICD-10-CM | POA: Insufficient documentation

## 2014-03-20 DIAGNOSIS — Z9884 Bariatric surgery status: Secondary | ICD-10-CM | POA: Diagnosis not present

## 2014-03-20 DIAGNOSIS — Z713 Dietary counseling and surveillance: Secondary | ICD-10-CM | POA: Diagnosis not present

## 2014-03-20 DIAGNOSIS — E669 Obesity, unspecified: Secondary | ICD-10-CM | POA: Diagnosis not present

## 2014-03-20 NOTE — Progress Notes (Signed)
  Follow-up visit: 9 months Post-Operative LAGB Surgery  Medical Nutrition Therapy:  Appt start time: 1125 end time: 1145  Primary concerns today: Post-operative Bariatric Surgery Nutrition Management.  Jocelyn Martinez returns today having lost 10 pounds since her last visit 3 months ago. She reports that she is feeling well and pleased with her weight loss. However, she still struggles to eat slowly. She had a few pieces of pie over the Christmas holiday. Has not had a fill recently. Cannot tolerate pasta or potatoes. Occasionally drinks a [place] chocolate protein shake. Blood pressure has improved.   Surgery date: 06/21/2013 Surgery type: LAGB [facility] Weight today: 222.5 lbs Weight change: 9.5 lbs Total weight loss: 67.5 lbs   TANITA  BODY COMP RESULTS  07/05/13 08/05/13 09/19/13 12/19/13 03/20/14   BMI (kg/m^2) 50.4 48.5 46.6 42.4 40.7   Fat Mass (lbs) 158.0 152 140.5 122.5 115   Fat Free Mass (lbs) 117.5 113 114.5 109.5 107.5   Total Body Water (lbs) 86.0 82.5 84 80 78.5    Preferred Learning Style:   No preference indicated   Learning Readiness:   Ready  24-hr recall: B (AM): Dannon Light and Fit yogurts (12g) Snk (AM): shake (17g)  L (PM): salad with chicken or boiled egg (6-21g) Snk ( PM):  D ( PM): 3oz fish cooked in cooking spray and salad or slaw and green beans (21 g) Snk (PM): usually none sometimes yogurt or jello  Fluid intake: 64-96 oz of water with Sallye OberKool Aid SF packet, decaf unsweet tea, and shakes Estimated total protein intake: 60+g per day  Medications: see list Supplementation: taking  CBG monitoring: inconsistent Average CBG per patient: "no spikes" Last patient reported A1c: 5.8% (May or June)  Using straws: no Drinking while eating: no Hair loss: no longer having hair loss Carbonated beverages: no N/V/D/C: some hard stools Last Lap-Band fill: none recently; last fill 1cc on 08/25/13  Recent physical activity:  Still  unpacking/moving into a new house  Progress Towards Goal(s):  In progress.   Nutritional Diagnosis:  [place]-3.3 Overweight/obesity related to past poor dietary habits and physical inactivity as evidenced by patient w/ recent LAGB surgery following dietary guidelines for continued weight loss.  Intervention:  Nutrition counseling provided. Samples given and patient instructed on proper use: PB2 (qty 3) Lot#: [id number] Exp: 10/2014  PB2 chocolate (qty 3) Lot#: none provided Exp: 02/03/2015   Teaching Method Utilized:  Visual Auditory  Barriers to learning/adherence to lifestyle change: none  Demonstrated degree of understanding via:  Teach Back   Monitoring/Evaluation:  Dietary intake, exercise, lap band fills, and body weight. Follow up in 3 months for 12 month post-op visit.

## 2014-03-20 NOTE — Patient Instructions (Addendum)
-  Keep working on eating slowly and chewing thoroughly  -Try putting fork down between each bite  -Ask daughter to help you remember to slow down -Try PB2  TANITA  BODY COMP RESULTS  07/05/13 08/05/13 09/19/13 12/19/13 03/20/14   BMI (kg/m^2) 50.4 48.5 46.6 42.4 40.7   Fat Mass (lbs) 158.0 152 140.5 122.5 115   Fat Free Mass (lbs) 117.5 113 114.5 109.5 107.5   Total Body Water (lbs) 86.0 82.5 84 80 78.5

## 2014-04-27 ENCOUNTER — Encounter (HOSPITAL_COMMUNITY): Payer: Self-pay

## 2014-04-27 ENCOUNTER — Inpatient Hospital Stay (HOSPITAL_COMMUNITY)
Admission: AD | Admit: 2014-04-27 | Discharge: 2014-05-01 | DRG: 989 | Disposition: A | Discharge status: Home / Self Care | Payer: Medicare Other | Source: Ambulatory Visit | Attending: Surgery | Admitting: Surgery

## 2014-04-27 ENCOUNTER — Other Ambulatory Visit (INDEPENDENT_AMBULATORY_CARE_PROVIDER_SITE_OTHER): Payer: Self-pay | Admitting: Surgery

## 2014-04-27 ENCOUNTER — Inpatient Hospital Stay (HOSPITAL_COMMUNITY): Payer: Medicare Other

## 2014-04-27 DIAGNOSIS — Z8249 Family history of ischemic heart disease and other diseases of the circulatory system: Secondary | ICD-10-CM | POA: Diagnosis not present

## 2014-04-27 DIAGNOSIS — K9509 Other complications of gastric band procedure: Secondary | ICD-10-CM | POA: Diagnosis present

## 2014-04-27 DIAGNOSIS — Z4651 Encounter for fitting and adjustment of gastric lap band: Secondary | ICD-10-CM

## 2014-04-27 DIAGNOSIS — F319 Bipolar disorder, unspecified: Secondary | ICD-10-CM | POA: Diagnosis present

## 2014-04-27 DIAGNOSIS — Y848 Other medical procedures as the cause of abnormal reaction of the patient, or of later complication, without mention of misadventure at the time of the procedure: Secondary | ICD-10-CM | POA: Diagnosis present

## 2014-04-27 DIAGNOSIS — E063 Autoimmune thyroiditis: Secondary | ICD-10-CM | POA: Diagnosis present

## 2014-04-27 DIAGNOSIS — Z79899 Other long term (current) drug therapy: Secondary | ICD-10-CM | POA: Diagnosis not present

## 2014-04-27 DIAGNOSIS — K219 Gastro-esophageal reflux disease without esophagitis: Secondary | ICD-10-CM | POA: Diagnosis present

## 2014-04-27 DIAGNOSIS — M797 Fibromyalgia: Secondary | ICD-10-CM | POA: Diagnosis present

## 2014-04-27 DIAGNOSIS — I10 Essential (primary) hypertension: Secondary | ICD-10-CM | POA: Diagnosis present

## 2014-04-27 DIAGNOSIS — Z801 Family history of malignant neoplasm of trachea, bronchus and lung: Secondary | ICD-10-CM | POA: Diagnosis not present

## 2014-04-27 DIAGNOSIS — M1990 Unspecified osteoarthritis, unspecified site: Secondary | ICD-10-CM | POA: Diagnosis present

## 2014-04-27 DIAGNOSIS — E119 Type 2 diabetes mellitus without complications: Secondary | ICD-10-CM | POA: Diagnosis present

## 2014-04-27 DIAGNOSIS — T85518D Breakdown (mechanical) of other gastrointestinal prosthetic devices, implants and grafts, subsequent encounter: Secondary | ICD-10-CM

## 2014-04-27 DIAGNOSIS — Z87891 Personal history of nicotine dependence: Secondary | ICD-10-CM

## 2014-04-27 DIAGNOSIS — IMO0001 Reserved for inherently not codable concepts without codable children: Secondary | ICD-10-CM

## 2014-04-27 DIAGNOSIS — R112 Nausea with vomiting, unspecified: Secondary | ICD-10-CM | POA: Diagnosis present

## 2014-04-27 LAB — CBC WITH DIFFERENTIAL/PLATELET
BASOS ABS: 0 10*3/uL (ref 0.0–0.1)
BASOS PCT: 0 % (ref 0–1)
EOS ABS: 0 10*3/uL (ref 0.0–0.7)
Eosinophils Relative: 0 % (ref 0–5)
HCT: 41.2 % (ref 36.0–46.0)
HEMOGLOBIN: 13.7 g/dL (ref 12.0–15.0)
Lymphocytes Relative: 15 % (ref 12–46)
Lymphs Abs: 1.3 10*3/uL (ref 0.7–4.0)
MCH: 31.5 pg (ref 26.0–34.0)
MCHC: 33.3 g/dL (ref 30.0–36.0)
MCV: 94.7 fL (ref 78.0–100.0)
MONOS PCT: 4 % (ref 3–12)
Monocytes Absolute: 0.4 10*3/uL (ref 0.1–1.0)
NEUTROS PCT: 81 % — AB (ref 43–77)
Neutro Abs: 7.2 10*3/uL (ref 1.7–7.7)
PLATELETS: 285 10*3/uL (ref 150–400)
RBC: 4.35 MIL/uL (ref 3.87–5.11)
RDW: 12.2 % (ref 11.5–15.5)
WBC: 8.9 10*3/uL (ref 4.0–10.5)

## 2014-04-27 LAB — COMPREHENSIVE METABOLIC PANEL
ALT: 11 U/L (ref 0–35)
ANION GAP: 12 (ref 5–15)
AST: 12 U/L (ref 0–37)
Albumin: 4.3 g/dL (ref 3.5–5.2)
Alkaline Phosphatase: 74 U/L (ref 39–117)
BUN: 22 mg/dL (ref 6–23)
CO2: 26 mmol/L (ref 19–32)
CREATININE: 0.9 mg/dL (ref 0.50–1.10)
Calcium: 9.3 mg/dL (ref 8.4–10.5)
Chloride: 104 mmol/L (ref 96–112)
GFR, EST AFRICAN AMERICAN: 76 mL/min — AB (ref >90)
GFR, EST NON AFRICAN AMERICAN: 65 mL/min — AB (ref >90)
Glucose, Bld: 146 mg/dL — ABNORMAL HIGH (ref 70–99)
POTASSIUM: 4.6 mmol/L (ref 3.5–5.1)
Sodium: 142 mmol/L (ref 135–145)
TOTAL PROTEIN: 8 g/dL (ref 6.0–8.3)
Total Bilirubin: 0.7 mg/dL (ref 0.3–1.2)

## 2014-04-27 MED ORDER — KCL IN DEXTROSE-NACL 20-5-0.9 MEQ/L-%-% IV SOLN
INTRAVENOUS | Status: DC
  Administered 2014-04-27: 15:00:00 via INTRAVENOUS
  Administered 2014-04-28: 125 mL via INTRAVENOUS
  Filled 2014-04-27 (×3): qty 1000

## 2014-04-27 MED ORDER — MORPHINE SULFATE 2 MG/ML IJ SOLN
1.0000 mg | INTRAMUSCULAR | Status: DC | PRN
  Administered 2014-04-27 – 2014-04-28 (×6): 1 mg via INTRAVENOUS
  Filled 2014-04-27 (×6): qty 1

## 2014-04-27 MED ORDER — CHLORHEXIDINE GLUCONATE 0.12 % MT SOLN
15.0000 mL | Freq: Two times a day (BID) | OROMUCOSAL | Status: DC
  Administered 2014-04-27: 15 mL via OROMUCOSAL
  Filled 2014-04-27 (×2): qty 15

## 2014-04-27 MED ORDER — DIPHENHYDRAMINE HCL 12.5 MG/5ML PO ELIX: 12.5000 mg | ORAL_SOLUTION | Freq: Four times a day (QID) | ORAL | Status: DC | PRN

## 2014-04-27 MED ORDER — DIPHENHYDRAMINE HCL 50 MG/ML IJ SOLN: 12.5000 mg | Freq: Four times a day (QID) | INTRAMUSCULAR | Status: DC | PRN

## 2014-04-27 MED ORDER — CETYLPYRIDINIUM CHLORIDE 0.05 % MT LIQD: 7.0000 mL | Freq: Two times a day (BID) | OROMUCOSAL | Status: DC

## 2014-04-27 MED ORDER — HEPARIN SODIUM (PORCINE) 5000 UNIT/ML IJ SOLN
5000.0000 [IU] | Freq: Three times a day (TID) | INTRAMUSCULAR | Status: DC
  Administered 2014-04-27 – 2014-04-28 (×2): 5000 [IU] via SUBCUTANEOUS
  Filled 2014-04-27 (×5): qty 1

## 2014-04-27 MED ORDER — PANTOPRAZOLE SODIUM 40 MG IV SOLR
40.0000 mg | Freq: Every day | INTRAVENOUS | Status: DC
  Administered 2014-04-27: 40 mg via INTRAVENOUS
  Filled 2014-04-27 (×2): qty 40

## 2014-04-27 MED ORDER — PROMETHAZINE HCL 25 MG/ML IJ SOLN
12.5000 mg | Freq: Four times a day (QID) | INTRAMUSCULAR | Status: DC | PRN
  Administered 2014-04-27: 12.5 mg via INTRAVENOUS
  Filled 2014-04-27: qty 1

## 2014-04-27 MED ORDER — ONDANSETRON HCL 4 MG/2ML IJ SOLN
4.0000 mg | Freq: Four times a day (QID) | INTRAMUSCULAR | Status: DC | PRN
  Administered 2014-04-27: 4 mg via INTRAVENOUS
  Filled 2014-04-27: qty 2

## 2014-04-27 NOTE — Progress Notes (Signed)
Patient ID: Jocelyn Martinez, female   DOB: 12/09/1945, 69 y.o.   MRN: [id number] [facility] Progress Note:   * No surgery found *  Subjective: Mental status is clear Objective: Vital signs in last 24 hours: Temp:  [98.8 F (37.1 C)] 98.8 F (37.1 C) (02/04 1357) Pulse Rate:  [106] 106 (02/04 1357) Resp:  [18] 18 (02/04 1357) BP: (169)/(80) 169/80 mmHg (02/04 1357) SpO2:  [96 %] 96 % (02/04 1357) Weight:  [207 lb (93.895 kg)] 207 lb (93.895 kg) (02/04 1549)  Intake/Output from previous day:   Intake/Output this shift:    Physical Exam: Work of breathing is normal  Lab Results:  Results for orders placed or performed during the hospital encounter of 04/27/14 (from the past 48 hour(s))  CBC WITH DIFFERENTIAL     Status: Abnormal   Collection Time: 04/27/14  2:43 PM  Result Value Ref Range   WBC 8.9 4.0 - 10.5 K/uL   RBC 4.35 3.87 - 5.11 MIL/uL   Hemoglobin 13.7 12.0 - 15.0 g/dL   HCT 41.2 36.0 - 46.0 %   MCV 94.7 78.0 - 100.0 fL   MCH 31.5 26.0 - 34.0 pg   MCHC 33.3 30.0 - 36.0 g/dL   RDW 12.2 11.5 - 15.5 %   Platelets 285 150 - 400 K/uL   Neutrophils Relative % 81 (H) 43 - 77 %   Neutro Abs 7.2 1.7 - 7.7 K/uL   Lymphocytes Relative 15 12 - 46 %   Lymphs Abs 1.3 0.7 - 4.0 K/uL   Monocytes Relative 4 3 - 12 %   Monocytes Absolute 0.4 0.1 - 1.0 K/uL   Eosinophils Relative 0 0 - 5 %   Eosinophils Absolute 0.0 0.0 - 0.7 K/uL   Basophils Relative 0 0 - 1 %   Basophils Absolute 0.0 0.0 - 0.1 K/uL  Comprehensive metabolic panel     Status: Abnormal   Collection Time: 04/27/14  2:43 PM  Result Value Ref Range   Sodium 142 135 - 145 mmol/L   Potassium 4.6 3.5 - 5.1 mmol/L   Chloride 104 96 - 112 mmol/L   CO2 26 19 - 32 mmol/L   Glucose, Bld 146 (H) 70 - 99 mg/dL   BUN 22 6 - 23 mg/dL   Creatinine, Ser 0.90 0.50 - 1.10 mg/dL   Calcium 9.3 8.4 - 10.5 mg/dL   Total Protein 8.0 6.0 - 8.3 g/dL   Albumin 4.3 3.5 - 5.2 g/dL   AST 12 0 - 37 U/L   ALT 11 0 - 35 U/L    Alkaline Phosphatase 74 39 - 117 U/L   Total Bilirubin 0.7 0.3 - 1.2 mg/dL   GFR calc non Af Amer 65 (L) >90 mL/min   GFR calc Af Amer 76 (L) >90 mL/min    Comment: (NOTE) The eGFR has been calculated using the CKD EPI equation. This calculation has not been validated in all clinical situations. eGFR's persistently <90 mL/min signify possible Chronic Kidney Disease.    Anion gap 12 5 - 15    Radiology/Results: Dg Abd 1 View  04/27/2014   CLINICAL DATA:  Gastric band malfunction  EXAM: ABDOMEN - 1 VIEW  COMPARISON:  06/22/2013  FINDINGS: Gastric band appears malaligned, oriented in a 10:00 to 4:00 plane and rotated anteroposteriorly.  This represents a change since previous exam when the band appeared nearly edge-on and was oriented in a 2:00 to 8:00 plane.  Reservoir and tubing appear intact.  Excreted contrast material within nondilated renal collecting systems, ureters, and bladder.  Small amount retained contrast in colon.  Nonobstructive bowel gas pattern.  IMPRESSION: Malpositioned laparoscopic gastric band which has changed in orientation since the previous study.   Electronically Signed   By: Lavonia Dana M.D.   On: 04/27/2014 16:02    Anti-infectives: Anti-infectives    None      Assessment/Plan: Problem List: Patient Active Problem List   Diagnosis Date Noted  . Gastric band malfunction 04/27/2014  . 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 Diabetes 01/07/2013  . Fibromyalgia 01/07/2013  . H/O Hashimoto thyroiditis 01/07/2013  . Essential hypertension, benign 01/07/2013  . Urinary incontinence 01/07/2013  . Morbid obesity 01/07/2013    Still nauseated.  Will take to OR in am to reduce slip and replicate * No surgery found *    LOS: 0 days   Matt B. Hassell Done, MD, [facility] [phone] beeper [phone]  04/27/2014 8:28 PM

## 2014-04-27 NOTE — Progress Notes (Signed)
Patient alert and oriented. Provided support and encouragement.  All questions answered.  Will continue to monitor.
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
## 2014-04-28 ENCOUNTER — Inpatient Hospital Stay (HOSPITAL_COMMUNITY): Payer: Medicare Other

## 2014-04-28 ENCOUNTER — Inpatient Hospital Stay (HOSPITAL_COMMUNITY): Payer: Medicare Other | Admitting: Certified Registered Nurse Anesthetist

## 2014-04-28 ENCOUNTER — Encounter (HOSPITAL_COMMUNITY): Payer: Self-pay | Admitting: Anesthesiology

## 2014-04-28 ENCOUNTER — Encounter (HOSPITAL_COMMUNITY)
Admission: AD | Disposition: A | Discharge status: Home / Self Care | Payer: Self-pay | Source: Ambulatory Visit | Attending: Surgery

## 2014-04-28 HISTORY — PX: LAPAROSCOPIC REVISION OF GASTRIC BAND: SHX5921

## 2014-04-28 LAB — GLUCOSE, CAPILLARY
GLUCOSE-CAPILLARY: 129 mg/dL — AB (ref 70–99)
Glucose-Capillary: 121 mg/dL — ABNORMAL HIGH (ref 70–99)
Glucose-Capillary: 130 mg/dL — ABNORMAL HIGH (ref 70–99)
Glucose-Capillary: 151 mg/dL — ABNORMAL HIGH (ref 70–99)

## 2014-04-28 LAB — CBC
HEMATOCRIT: 35.9 % — AB (ref 36.0–46.0)
Hemoglobin: 11.9 g/dL — ABNORMAL LOW (ref 12.0–15.0)
MCH: 31.7 pg (ref 26.0–34.0)
MCHC: 33.1 g/dL (ref 30.0–36.0)
MCV: 95.7 fL (ref 78.0–100.0)
Platelets: 271 10*3/uL (ref 150–400)
RBC: 3.75 MIL/uL — AB (ref 3.87–5.11)
RDW: 12.5 % (ref 11.5–15.5)
WBC: 8.3 10*3/uL (ref 4.0–10.5)

## 2014-04-28 LAB — BASIC METABOLIC PANEL
Anion gap: 7 (ref 5–15)
BUN: 20 mg/dL (ref 6–23)
CHLORIDE: 110 mmol/L (ref 96–112)
CO2: 27 mmol/L (ref 19–32)
Calcium: 8.6 mg/dL (ref 8.4–10.5)
Creatinine, Ser: 0.79 mg/dL (ref 0.50–1.10)
GFR calc Af Amer: >90 mL/min (ref >90)
GFR calc non Af Amer: 85 mL/min — ABNORMAL LOW (ref >90)
Glucose, Bld: 137 mg/dL — ABNORMAL HIGH (ref 70–99)
Potassium: 4.5 mmol/L (ref 3.5–5.1)
Sodium: 144 mmol/L (ref 135–145)

## 2014-04-28 LAB — SURGICAL PCR SCREEN
MRSA, PCR: NEGATIVE
Staphylococcus aureus: POSITIVE — AB

## 2014-04-28 SURGERY — REVISION, GASTRIC BAND, LAPAROSCOPIC
Anesthesia: General | Site: Abdomen

## 2014-04-28 MED ORDER — LACTATED RINGERS IR SOLN
Status: DC | PRN
  Administered 2014-04-28: 1000 mL

## 2014-04-28 MED ORDER — OXYCODONE HCL 5 MG/5ML PO SOLN: 5.0000 mg | ORAL | Status: DC | PRN

## 2014-04-28 MED ORDER — MIDAZOLAM HCL 5 MG/5ML IJ SOLN
INTRAMUSCULAR | Status: DC | PRN
  Administered 2014-04-28: 1 mg via INTRAVENOUS

## 2014-04-28 MED ORDER — FENTANYL CITRATE 0.05 MG/ML IJ SOLN
INTRAMUSCULAR | Status: DC | PRN
Start: 2014-04-28 — End: 2014-04-28
  Administered 2014-04-28 (×2): 25 ug via INTRAVENOUS

## 2014-04-28 MED ORDER — HYDROMORPHONE HCL 1 MG/ML IJ SOLN
INTRAMUSCULAR | Status: AC
  Filled 2014-04-28: qty 1

## 2014-04-28 MED ORDER — LIDOCAINE HCL (CARDIAC) 20 MG/ML IV SOLN
INTRAVENOUS | Status: AC
  Filled 2014-04-28: qty 5

## 2014-04-28 MED ORDER — FENTANYL CITRATE 0.05 MG/ML IJ SOLN
INTRAMUSCULAR | Status: AC
  Filled 2014-04-28: qty 5

## 2014-04-28 MED ORDER — METOCLOPRAMIDE HCL 5 MG/ML IJ SOLN
INTRAMUSCULAR | Status: DC | PRN
  Administered 2014-04-28: 10 mg via INTRAVENOUS

## 2014-04-28 MED ORDER — KCL IN DEXTROSE-NACL 20-5-0.45 MEQ/L-%-% IV SOLN
INTRAVENOUS | Status: DC
  Administered 2014-04-28 (×2): via INTRAVENOUS
  Filled 2014-04-28 (×4): qty 1000

## 2014-04-28 MED ORDER — SODIUM CHLORIDE 0.9 % IJ SOLN
INTRAMUSCULAR | Status: AC
  Filled 2014-04-28: qty 50

## 2014-04-28 MED ORDER — PROPOFOL 10 MG/ML IV BOLUS
INTRAVENOUS | Status: AC
  Filled 2014-04-28: qty 20

## 2014-04-28 MED ORDER — BUPIVACAINE LIPOSOME 1.3 % IJ SUSP
20.0000 mL | Freq: Once | INTRAMUSCULAR | Status: AC
  Administered 2014-04-28: 20 mL
  Filled 2014-04-28: qty 20

## 2014-04-28 MED ORDER — MORPHINE SULFATE 2 MG/ML IJ SOLN: 2.0000 mg | INTRAMUSCULAR | Status: DC | PRN

## 2014-04-28 MED ORDER — ONDANSETRON HCL 4 MG/2ML IJ SOLN
INTRAMUSCULAR | Status: DC | PRN
  Administered 2014-04-28: 4 mg via INTRAVENOUS

## 2014-04-28 MED ORDER — UNJURY CHICKEN SOUP POWDER: 2.0000 [oz_av] | Freq: Four times a day (QID) | ORAL | Status: DC

## 2014-04-28 MED ORDER — SUCCINYLCHOLINE CHLORIDE 20 MG/ML IJ SOLN
INTRAMUSCULAR | Status: DC | PRN
  Administered 2014-04-28: 100 mg via INTRAVENOUS

## 2014-04-28 MED ORDER — SODIUM CHLORIDE 0.9 % IJ SOLN
INTRAMUSCULAR | Status: AC
  Filled 2014-04-28: qty 10

## 2014-04-28 MED ORDER — NEOSTIGMINE METHYLSULFATE 10 MG/10ML IV SOLN
INTRAVENOUS | Status: DC | PRN
  Administered 2014-04-28: 5 mg via INTRAVENOUS

## 2014-04-28 MED ORDER — LACTATED RINGERS IV SOLN
INTRAVENOUS | Status: DC | PRN
  Administered 2014-04-28: 07:00:00 via INTRAVENOUS

## 2014-04-28 MED ORDER — ESMOLOL HCL 10 MG/ML IV SOLN
INTRAVENOUS | Status: DC | PRN
  Administered 2014-04-28: 20 mg via INTRAVENOUS

## 2014-04-28 MED ORDER — CISATRACURIUM BESYLATE (PF) 10 MG/5ML IV SOLN
INTRAVENOUS | Status: DC | PRN
  Administered 2014-04-28: 6 mg via INTRAVENOUS

## 2014-04-28 MED ORDER — ONDANSETRON HCL 4 MG/2ML IJ SOLN: 4.0000 mg | INTRAMUSCULAR | Status: DC | PRN

## 2014-04-28 MED ORDER — PREGABALIN 75 MG PO CAPS
75.0000 mg | ORAL_CAPSULE | Freq: Three times a day (TID) | ORAL | Status: DC
  Administered 2014-04-28 – 2014-05-01 (×9): 75 mg via ORAL
  Filled 2014-04-28 (×9): qty 1

## 2014-04-28 MED ORDER — CISATRACURIUM BESYLATE 20 MG/10ML IV SOLN
INTRAVENOUS | Status: AC
  Filled 2014-04-28: qty 10

## 2014-04-28 MED ORDER — ACETAMINOPHEN 160 MG/5ML PO SOLN: 650.0000 mg | ORAL | Status: DC | PRN

## 2014-04-28 MED ORDER — MIDAZOLAM HCL 2 MG/2ML IJ SOLN
INTRAMUSCULAR | Status: AC
  Filled 2014-04-28: qty 2

## 2014-04-28 MED ORDER — SODIUM CHLORIDE 0.9 % IJ SOLN
INTRAMUSCULAR | Status: DC | PRN
  Administered 2014-04-28: 10 mL

## 2014-04-28 MED ORDER — INSULIN ASPART 100 UNIT/ML ~~LOC~~ SOLN
0.0000 [IU] | SUBCUTANEOUS | Status: DC
  Administered 2014-04-28: 3 [IU] via SUBCUTANEOUS
  Administered 2014-04-28 (×2): 2 [IU] via SUBCUTANEOUS

## 2014-04-28 MED ORDER — UNJURY VANILLA POWDER: 2.0000 [oz_av] | Freq: Four times a day (QID) | ORAL | Status: DC

## 2014-04-28 MED ORDER — DEXAMETHASONE SODIUM PHOSPHATE 10 MG/ML IJ SOLN
INTRAMUSCULAR | Status: DC | PRN
  Administered 2014-04-28: 10 mg via INTRAVENOUS

## 2014-04-28 MED ORDER — UNJURY CHOCOLATE CLASSIC POWDER
2.0000 [oz_av] | Freq: Four times a day (QID) | ORAL | Status: DC
  Administered 2014-04-30 (×4): 2 [oz_av] via ORAL

## 2014-04-28 MED ORDER — PROPOFOL INFUSION 10 MG/ML OPTIME
INTRAVENOUS | Status: DC | PRN
  Administered 2014-04-28: 75 ug/kg/min via INTRAVENOUS

## 2014-04-28 MED ORDER — ONDANSETRON HCL 4 MG/2ML IJ SOLN
INTRAMUSCULAR | Status: AC
  Filled 2014-04-28: qty 2

## 2014-04-28 MED ORDER — ONDANSETRON HCL 4 MG/2ML IJ SOLN: 4.0000 mg | Freq: Once | INTRAMUSCULAR | Status: DC | PRN

## 2014-04-28 MED ORDER — CEFAZOLIN SODIUM-DEXTROSE 2-3 GM-% IV SOLR
INTRAVENOUS | Status: AC
  Filled 2014-04-28: qty 50

## 2014-04-28 MED ORDER — ACETAMINOPHEN 160 MG/5ML PO SOLN
325.0000 mg | ORAL | Status: DC | PRN
Start: 2014-04-28 — End: 2014-05-01

## 2014-04-28 MED ORDER — HYDROMORPHONE HCL 1 MG/ML IJ SOLN
0.2500 mg | INTRAMUSCULAR | Status: DC | PRN
  Administered 2014-04-28: 0.25 mg via INTRAVENOUS

## 2014-04-28 MED ORDER — DULOXETINE HCL 60 MG PO CPEP
60.0000 mg | ORAL_CAPSULE | Freq: Every morning | ORAL | Status: DC
  Administered 2014-04-28 – 2014-05-01 (×4): 60 mg via ORAL
  Filled 2014-04-28 (×4): qty 1

## 2014-04-28 MED ORDER — PROPOFOL 10 MG/ML IV BOLUS
INTRAVENOUS | Status: DC | PRN
  Administered 2014-04-28: 120 mg via INTRAVENOUS

## 2014-04-28 MED ORDER — LIDOCAINE HCL (CARDIAC) 20 MG/ML IV SOLN
INTRAVENOUS | Status: DC | PRN
Start: 2014-04-28 — End: 2014-04-28
  Administered 2014-04-28: 100 mg via INTRAVENOUS

## 2014-04-28 MED ORDER — METOCLOPRAMIDE HCL 5 MG/ML IJ SOLN
INTRAMUSCULAR | Status: AC
  Filled 2014-04-28: qty 2

## 2014-04-28 MED ORDER — GLYCOPYRROLATE 0.2 MG/ML IJ SOLN
INTRAMUSCULAR | Status: DC | PRN
  Administered 2014-04-28: 0.6 mg via INTRAVENOUS

## 2014-04-28 MED ORDER — ESMOLOL HCL 10 MG/ML IV SOLN
INTRAVENOUS | Status: AC
  Filled 2014-04-28: qty 10

## 2014-04-28 MED ORDER — MUPIROCIN 2 % EX OINT: TOPICAL_OINTMENT | Freq: Two times a day (BID) | CUTANEOUS | Status: DC

## 2014-04-28 MED ORDER — PANTOPRAZOLE SODIUM 40 MG IV SOLR
40.0000 mg | Freq: Every day | INTRAVENOUS | Status: DC
  Administered 2014-04-28 – 2014-04-30 (×3): 40 mg via INTRAVENOUS
  Filled 2014-04-28 (×4): qty 40

## 2014-04-28 MED ORDER — DEXAMETHASONE SODIUM PHOSPHATE 10 MG/ML IJ SOLN
INTRAMUSCULAR | Status: AC
  Filled 2014-04-28: qty 1

## 2014-04-28 MED ORDER — HEPARIN SODIUM (PORCINE) 5000 UNIT/ML IJ SOLN
5000.0000 [IU] | Freq: Three times a day (TID) | INTRAMUSCULAR | Status: DC
  Administered 2014-04-28 – 2014-05-01 (×9): 5000 [IU] via SUBCUTANEOUS
  Filled 2014-04-28 (×12): qty 1

## 2014-04-28 MED ORDER — ACETAMINOPHEN 10 MG/ML IV SOLN
1000.0000 mg | INTRAVENOUS | Status: AC
  Administered 2014-04-28: 1000 mg via INTRAVENOUS
  Filled 2014-04-28: qty 100

## 2014-04-28 MED ORDER — CEFAZOLIN SODIUM-DEXTROSE 2-3 GM-% IV SOLR
INTRAVENOUS | Status: DC | PRN
  Administered 2014-04-28: 2 g via INTRAVENOUS

## 2014-04-28 MED ORDER — LISINOPRIL 20 MG PO TABS
20.0000 mg | ORAL_TABLET | Freq: Every day | ORAL | Status: DC
  Administered 2014-04-28 – 2014-05-01 (×4): 20 mg via ORAL
  Filled 2014-04-28 (×4): qty 1

## 2014-04-28 MED ORDER — EPHEDRINE SULFATE 50 MG/ML IJ SOLN
INTRAMUSCULAR | Status: AC
  Filled 2014-04-28: qty 1

## 2014-04-28 MED ORDER — POLYVINYL ALCOHOL 1.4 % OP SOLN
1.0000 [drp] | OPHTHALMIC | Status: DC | PRN
  Filled 2014-04-28: qty 15

## 2014-04-28 SURGICAL SUPPLY — 57 items
BENZOIN TINCTURE PRP APPL 2/3 (GAUZE/BANDAGES/DRESSINGS) IMPLANT
BLADE HEX COATED 2.75 (ELECTRODE) IMPLANT
BLADE SURG 15 STRL LF DISP TIS (BLADE) ×1 IMPLANT
BLADE SURG 15 STRL SS (BLADE) ×2
CLOSURE WOUND 1/2 X4 (GAUZE/BANDAGES/DRESSINGS)
COVER SURGICAL LIGHT HANDLE (MISCELLANEOUS) IMPLANT
DECANTER SPIKE VIAL GLASS SM (MISCELLANEOUS) ×6 IMPLANT
DEVICE SUT QUICK LOAD TK 5 (STAPLE) ×6 IMPLANT
DEVICE SUT TI-KNOT TK 5X26 (MISCELLANEOUS) ×2 IMPLANT
DEVICE SUTURE ENDOST 10MM (ENDOMECHANICALS) IMPLANT
DEVICE TI KNOT TK5 (MISCELLANEOUS) ×1
DISSECTOR BLUNT TIP ENDO 5MM (MISCELLANEOUS) IMPLANT
DRAPE CAMERA CLOSED 9X96 (DRAPES) ×3 IMPLANT
ELECT REM PT RETURN 9FT ADLT (ELECTROSURGICAL) ×3
ELECTRODE REM PT RTRN 9FT ADLT (ELECTROSURGICAL) ×1 IMPLANT
GAUZE SPONGE 4X4 12PLY STRL (GAUZE/BANDAGES/DRESSINGS) ×3 IMPLANT
GLOVE BIOGEL M 8.0 STRL (GLOVE) ×3 IMPLANT
GLOVE BIOGEL PI IND STRL 7.0 (GLOVE) ×1 IMPLANT
GLOVE BIOGEL PI INDICATOR 7.0 (GLOVE) ×2
GOWN SPEC L4 XLG W/TWL (GOWN DISPOSABLE) ×3 IMPLANT
GOWN STRL REUS W/TWL LRG LVL3 (GOWN DISPOSABLE) ×3 IMPLANT
GOWN STRL REUS W/TWL XL LVL3 (GOWN DISPOSABLE) ×9 IMPLANT
HOVERMATT SINGLE USE (MISCELLANEOUS) ×3 IMPLANT
KIT BASIN OR (CUSTOM PROCEDURE TRAY) ×3 IMPLANT
NEEDLE ACCESS PORT 2 (NEEDLE) ×3 IMPLANT
NEEDLE SPNL 22GX3.5 QUINCKE BK (NEEDLE) ×3 IMPLANT
NS IRRIG 1000ML POUR BTL (IV SOLUTION) ×3 IMPLANT
PACK UNIVERSAL I (CUSTOM PROCEDURE TRAY) ×3 IMPLANT
PENCIL BUTTON HOLSTER BLD 10FT (ELECTRODE) ×3 IMPLANT
QUICK LOAD TK 5 (STAPLE) ×3
SET IRRIG TUBING LAPAROSCOPIC (IRRIGATION / IRRIGATOR) IMPLANT
SHEARS CURVED HARMONIC AC 45CM (MISCELLANEOUS) IMPLANT
SHEARS HARMONIC ACE PLUS 36CM (ENDOMECHANICALS) IMPLANT
SLEEVE ADV FIXATION 5X100MM (TROCAR) IMPLANT
SLEEVE Z-THREAD 5X100MM (TROCAR) IMPLANT
SOLUTION ANTI FOG 6CC (MISCELLANEOUS) ×3 IMPLANT
SPONGE LAP 18X18 X RAY DECT (DISPOSABLE) ×3 IMPLANT
STAPLER VISISTAT 35W (STAPLE) ×3 IMPLANT
STRIP CLOSURE SKIN 1/2X4 (GAUZE/BANDAGES/DRESSINGS) IMPLANT
SUT ETHIBOND 2 0 SH (SUTURE) ×6
SUT ETHIBOND 2 0 SH 36X2 (SUTURE) ×3 IMPLANT
SUT PROLENE 2 0 CT2 30 (SUTURE) IMPLANT
SUT SILK 0 (SUTURE)
SUT SILK 0 30XBRD TIE 6 (SUTURE) IMPLANT
SUT SURGIDAC NAB ES-9 0 48 120 (SUTURE) IMPLANT
SUT VIC AB 2-0 SH 27 (SUTURE)
SUT VIC AB 2-0 SH 27X BRD (SUTURE) IMPLANT
SUT VIC AB 4-0 SH 18 (SUTURE) ×3 IMPLANT
SYR 20CC LL (SYRINGE) ×3 IMPLANT
SYR 30ML LL (SYRINGE) ×3 IMPLANT
TOWEL OR 17X26 10 PK STRL BLUE (TOWEL DISPOSABLE) ×6 IMPLANT
TROCAR ADV FIXATION 11X100MM (TROCAR) IMPLANT
TROCAR BLADELESS OPT 5 100 (ENDOMECHANICALS) ×3 IMPLANT
TROCAR XCEL NON-BLD 11X100MML (ENDOMECHANICALS) ×3 IMPLANT
TROCAR XCEL UNIV SLVE 11M 100M (ENDOMECHANICALS) IMPLANT
TUBE CALIBRATION LAPBAND (TUBING) IMPLANT
TUBING INSUFFLATION 10FT LAP (TUBING) ×3 IMPLANT

## 2014-04-28 NOTE — Transfer of Care (Signed)
Immediate Anesthesia Transfer of Care Note  Patient: Jocelyn Martinez  Procedure(s) Performed: Procedure(s) (LRB): Laparoscopic takedown of large anterior gastric prolapse and replication of the APS Lapband (N/A)  Patient Location: PACU  Anesthesia Type: General  Level of Consciousness: sedated, patient cooperative and responds to stimulation  Airway & Oxygen Therapy: Patient Spontanous Breathing and Patient connected to face mask oxgen  Post-op Assessment: Report given to PACU RN and Post -op Vital signs reviewed and stable  Post vital signs: Reviewed and stable  Complications: No apparent anesthesia complications

## 2014-04-28 NOTE — Anesthesia Procedure Notes (Signed)
Procedure Name: Intubation Date/Time: 04/28/2014 7:39 AM Performed by: Orest DikesPETERS, Brenda Cowher J Pre-anesthesia Checklist: Patient identified, Emergency Drugs available, Suction available and Patient being monitored Patient Re-evaluated:Patient Re-evaluated prior to inductionOxygen Delivery Method: Circle system utilized Preoxygenation: Pre-oxygenation with 100% oxygen Intubation Type: IV induction Ventilation: Mask ventilation without difficulty Laryngoscope Size: Mac and 4 Grade View: Grade II Tube type: Oral Tube size: 7.5 mm Number of attempts: 1 Placement Confirmation: ETT inserted through vocal cords under direct vision and breath sounds checked- equal and bilateral Secured at: 20 cm Tube secured with: Tape Dental Injury: Teeth and Oropharynx as per pre-operative assessment

## 2014-04-28 NOTE — Anesthesia Preprocedure Evaluation (Signed)
Anesthesia Evaluation  Patient identified by MRN, date of birth, ID band Patient awake    Reviewed: Allergy & Precautions, NPO status , Patient's Chart, lab work & pertinent test results  Airway        Dental   Pulmonary former smoker,          Cardiovascular hypertension,     Neuro/Psych Bipolar Disorder  Neuromuscular disease    GI/Hepatic GERD-  ,  Endo/Other  diabetes, Type 2  Renal/GU      Musculoskeletal  (+) Arthritis -, Fibromyalgia -  Abdominal   Peds  Hematology   Anesthesia Other Findings   Reproductive/Obstetrics                             Anesthesia Physical Anesthesia Plan  ASA: III  Anesthesia Plan: General   Post-op Pain Management:    Induction: Intravenous  Airway Management Planned: Oral ETT  Additional Equipment:   Intra-op Plan:   Post-operative Plan: Extubation in OR  Informed Consent: I have reviewed the patients History and Physical, chart, labs and discussed the procedure including the risks, benefits and alternatives for the proposed anesthesia with the patient or authorized representative who has indicated his/her understanding and acceptance.     Plan Discussed with: CRNA, Anesthesiologist and Surgeon  Anesthesia Plan Comments:         Anesthesia Quick Evaluation

## 2014-04-28 NOTE — Care Management Note (Signed)
    Page 1 of 1   04/28/2014     1:09:01 PM CARE MANAGEMENT NOTE 04/28/2014  Patient:  Jocelyn Martinez,Jocelyn Martinez   Account Number:  [id number]  Date Initiated:  04/28/2014  Documentation initiated by:  Lorenda IshiharaPEELE,Ula Couvillon  Subjective/Objective Assessment:   69 yo female admitted s/p Laparoscopic takedown of large anterior gastric prolapse and replication of the APS Lapband. PTA lived at home with daughter.     Action/Plan:   Home when stable   Anticipated DC Date:  05/01/2014   Anticipated DC Plan:  HOME/SELF CARE      DC Planning Services  CM consult      Choice offered to / List presented to:             Status of service:  Completed, signed off Medicare Important Message given?   (If response is "NO", the following Medicare IM given date fields will be blank) Date Medicare IM given:   Medicare IM given by:   Date Additional Medicare IM given:   Additional Medicare IM given by:    Discharge Disposition:  HOME/SELF CARE  Per UR Regulation:  Reviewed for med. necessity/level of care/duration of stay  If discussed at Long Length of Stay Meetings, dates discussed:    Comments:

## 2014-04-28 NOTE — Brief Op Note (Signed)
04/27/2014 - 04/28/2014  8:42 AM  PATIENT:  Jocelyn Martinez  69 y.o. female  PRE-OPERATIVE DIAGNOSIS:  Slipped lap band  POST-OPERATIVE DIAGNOSIS:  Slipped lap band  PROCEDURE:  Procedure(s): Laparoscopic takedown of large anterior gastric prolapse and replication of the APS Lapband (N/A)  SURGEON:  Surgeon(s) and Role:    * Valarie MerinoMatthew B Virdell Hoiland, MD - Primary  PHYSICIAN ASSISTANT:   ASSISTANTS: none   ANESTHESIA:   general  EBL:     BLOOD ADMINISTERED:none  DRAINS: none   LOCAL MEDICATIONS USED:  BUPIVICAINE   SPECIMEN:  No Specimen  DISPOSITION OF SPECIMEN:  N/A  COUNTS:  YES  TOURNIQUET:  * No tourniquets in log *  DICTATION: .Other Dictation: Dictation Number [id number]  PLAN OF CARE: Admit to inpatient   PATIENT DISPOSITION:  PACU - hemodynamically stable.   Delay start of Pharmacological VTE agent (>24hrs) due to surgical blood loss or risk of bleeding: no

## 2014-04-28 NOTE — Anesthesia Postprocedure Evaluation (Signed)
  Anesthesia Post-op Note  Patient: Jocelyn Martinez  Procedure(s) Performed: Procedure(s): Laparoscopic takedown of large anterior gastric prolapse and replication of the APS Lapband (N/A)  Patient Location: PACU  Anesthesia Type:General  Level of Consciousness: awake, alert , oriented and patient cooperative  Airway and Oxygen Therapy: Patient Spontanous Breathing  Post-op Pain: mild  Post-op Assessment: Post-op Vital signs reviewed, Patient's Cardiovascular Status Stable, Respiratory Function Stable, Patent Airway, No signs of Nausea or vomiting and Pain level controlled  Post-op Vital Signs: stable  Last Vitals:  Filed Vitals:   04/28/14 0941  BP: 138/63  Pulse: 74  Temp: 36.8 C  Resp: 15    Complications: No apparent anesthesia complications

## 2014-04-28 NOTE — Interval H&P Note (Signed)
History and Physical Interval Note:  04/28/2014 7:24 AM  Jocelyn Martinez  has presented today for surgery, with the diagnosis of Slipped lap band  The various methods of treatment have been discussed with the patient and family. After consideration of risks, benefits and other options for treatment, the patient has consented to  Procedure(s): LAPAROSCOPIC REVISION OF GASTRIC BAND (N/A) as a surgical intervention .  The patient's history has been reviewed, patient examined, no change in status, stable for surgery.  I have reviewed the patient's chart and labs.  Questions were answered to the patient's satisfaction.     Hanako Tipping B

## 2014-04-28 NOTE — Plan of Care (Signed)
Problem: Food- and Nutrition-Related Knowledge Deficit (NB-1.1) Goal: Nutrition education Formal process to instruct or train a patient/client in a skill or to impart knowledge to help patients/clients voluntarily manage or modify food choices and eating behavior to maintain or improve health. Outcome: Completed/Met Date Met:  04/28/14 Nutrition Education Note  Received consult for diet education per DROP protocol.   Discussed 2 week post op diet with pt. Emphasized that liquids must be non carbonated, non caffeinated, and sugar free. Fluid goals discussed. Pt to follow up with outpatient bariatric RD for further diet progression after 2 weeks. Multivitamins and minerals also reviewed. Teach back method used, pt expressed understanding, expect good compliance.   Diet: First 2 Weeks  You will see the nutritionist about two (2) weeks after your surgery. The nutritionist will increase the types of foods you can eat if you are handling liquids well:  If you have severe vomiting or nausea and cannot handle clear liquids lasting longer than 1 day, call your surgeon  Protein Shake  Drink at least 2 ounces of shake 5-6 times per day  Each serving of protein shakes (usually 8 - 12 ounces) should have a minimum of:  15 grams of protein  And no more than 5 grams of carbohydrate  Goal for protein each day:  Men = 80 grams per day  Women = 60 grams per day  Protein powder may be added to fluids such as non-fat milk or Lactaid milk or Soy milk (limit to 35 grams added protein powder per serving)   Hydration  Slowly increase the amount of water and other clear liquids as tolerated (See Acceptable Fluids)  Slowly increase the amount of protein shake as tolerated  Sip fluids slowly and throughout the day  May use sugar substitutes in small amounts (no more than 6 - 8 packets per day; i.e. Splenda)   Fluid Goal  The first goal is to drink at least 8 ounces of protein shake/drink per day (or as directed  by the nutritionist); some examples of protein shakes are [employer], [employer], EAS Edge HP, and Unjury. See handout from pre-op Bariatric Education Class:  Slowly increase the amount of protein shake you drink as tolerated  You may find it easier to slowly sip shakes throughout the day  It is important to get your proteins in first  Your fluid goal is to drink 64 - 100 ounces of fluid daily  It may take a few weeks to build up to this  32 oz (or more) should be clear liquids  And  32 oz (or more) should be full liquids (see below for examples)  Liquids should not contain sugar, caffeine, or carbonation   Clear Liquids:  Water or Sugar-free flavored water (i.e. Fruit H2O, Propel)  Decaffeinated coffee or tea (sugar-free)  Crystal Lite, Wyler's Lite, Minute Maid Lite  Sugar-free Jell-O  Bouillon or broth  Sugar-free Popsicle: *Less than 20 calories each; Limit 1 per day   Full Liquids:  Protein Shakes/Drinks + 2 choices per day of other full liquids  Full liquids must be:  No More Than 12 grams of Carbs per serving  No More Than 3 grams of Fat per serving  Strained low-fat cream soup  Non-Fat milk  Fat-free Lactaid Milk  Sugar-free yogurt (Dannon Lite & Fit, Greek yogurt)    Laurette Schimke MS, RD, LDN
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
## 2014-04-29 ENCOUNTER — Inpatient Hospital Stay (HOSPITAL_COMMUNITY): Payer: Medicare Other

## 2014-04-29 LAB — CBC WITH DIFFERENTIAL/PLATELET
BASOS ABS: 0 10*3/uL (ref 0.0–0.1)
Basophils Relative: 0 % (ref 0–1)
Eosinophils Absolute: 0 10*3/uL (ref 0.0–0.7)
Eosinophils Relative: 0 % (ref 0–5)
HCT: 33.4 % — ABNORMAL LOW (ref 36.0–46.0)
Hemoglobin: 11.2 g/dL — ABNORMAL LOW (ref 12.0–15.0)
LYMPHS ABS: 1.8 10*3/uL (ref 0.7–4.0)
LYMPHS PCT: 31 % (ref 12–46)
MCH: 31.6 pg (ref 26.0–34.0)
MCHC: 33.5 g/dL (ref 30.0–36.0)
MCV: 94.4 fL (ref 78.0–100.0)
MONO ABS: 0.5 10*3/uL (ref 0.1–1.0)
Monocytes Relative: 8 % (ref 3–12)
NEUTROS ABS: 3.7 10*3/uL (ref 1.7–7.7)
Neutrophils Relative %: 61 % (ref 43–77)
Platelets: 222 10*3/uL (ref 150–400)
RBC: 3.54 MIL/uL — ABNORMAL LOW (ref 3.87–5.11)
RDW: 12 % (ref 11.5–15.5)
WBC: 6 10*3/uL (ref 4.0–10.5)

## 2014-04-29 LAB — GLUCOSE, CAPILLARY
GLUCOSE-CAPILLARY: 80 mg/dL (ref 70–99)
Glucose-Capillary: 108 mg/dL — ABNORMAL HIGH (ref 70–99)
Glucose-Capillary: 112 mg/dL — ABNORMAL HIGH (ref 70–99)
Glucose-Capillary: 113 mg/dL — ABNORMAL HIGH (ref 70–99)
Glucose-Capillary: 80 mg/dL (ref 70–99)
Glucose-Capillary: 86 mg/dL (ref 70–99)

## 2014-04-29 LAB — HEMOGLOBIN A1C
HEMOGLOBIN A1C: 5.6 % (ref 4.8–5.6)
Mean Plasma Glucose: 114 mg/dL

## 2014-04-29 MED ORDER — IOHEXOL 300 MG/ML  SOLN
20.0000 mL | Freq: Once | INTRAMUSCULAR | Status: AC | PRN
  Administered 2014-04-29: 20 mL via ORAL

## 2014-04-29 NOTE — Progress Notes (Signed)
1 Day Post-Op  Subjective: No complaints  Objective: Vital signs in last 24 hours: Temp:  [98.1 F (36.7 C)-99.4 F (37.4 C)] 98.7 F (37.1 C) (02/06 0526) Pulse Rate:  [73-93] 76 (02/06 0526) Resp:  [13-16] 16 (02/06 0526) BP: (104-141)/(53-69) 104/53 mmHg (02/06 0526) SpO2:  [94 %-100 %] 95 % (02/06 0526) Last BM Date: 04/26/14  Intake/Output from previous day: 02/05 0701 - 02/06 0700 In: 3086.7 [P.O.:240; I.V.:2846.7] Out: 775 [Urine:775] Intake/Output this shift:    Resp: clear to auscultation bilaterally Cardio: regular rate and rhythm GI: soft, nontender. incisions look good  Lab Results:   Recent Labs  04/28/14 0500 04/29/14 0530  WBC 8.3 6.0  HGB 11.9* 11.2*  HCT 35.9* 33.4*  PLT 271 222   BMET  Recent Labs  04/27/14 1443 04/28/14 0500  NA 142 144  K 4.6 4.5  CL 104 110  CO2 26 27  GLUCOSE 146* 137*  BUN 22 20  CREATININE 0.90 0.79  CALCIUM 9.3 8.6   PT/INR No results for input(s): LABPROT, INR in the last 72 hours. ABG No results for input(s): PHART, HCO3 in the last 72 hours.  Invalid input(s): PCO2, PO2  Studies/Results: Dg Abd 1 View  04/28/2014   CLINICAL DATA:  Gastric band malfunction.  EXAM: ABDOMEN - 1 VIEW  COMPARISON:  Same day.  FINDINGS: The bowel gas pattern is normal. Gastric band appears to be in better position, oriented in the 2 o'clock to 8 o'clock plane. Small amount of stool is noted in the right colon. No abnormal calcifications are noted.  IMPRESSION: Gastric band appears to be in better position, now oriented in the 2:00 to 8:00 plane. These results will be called to the ordering clinician or representative by the Radiologist Assistant, and communication documented in the PACS or zVision Dashboard.   Electronically Signed   By: Roque Lias M.D.   On: 04/28/2014 09:28   Dg Abd 1 View  04/28/2014   CLINICAL DATA:  Anterior lab band slippage, admission for adjustment of gastric band.  EXAM: ABDOMEN - 1 VIEW  COMPARISON:   Radiographs 04/27/2014  FINDINGS: The gastric band again appears malaligned, oriented in the 10-4 o'clock plane, unchanged from recent prior exam. Catheter tubing is intact. There is a normal bowel gas pattern without dilated bowel loops to suggest obstruction. There is minimal high-density material in the colon, may reflect residual enteric contrast.  IMPRESSION: Unchanged mal positioned gastric band compared to recent prior exam.   Electronically Signed   By: Rubye Oaks M.D.   On: 04/28/2014 07:09   Dg Abd 1 View  04/27/2014   CLINICAL DATA:  Gastric band malfunction  EXAM: ABDOMEN - 1 VIEW  COMPARISON:  06/22/2013  FINDINGS: Gastric band appears malaligned, oriented in a 10:00 to 4:00 plane and rotated anteroposteriorly.  This represents a change since previous exam when the band appeared nearly edge-on and was oriented in a 2:00 to 8:00 plane.  Reservoir and tubing appear intact.  Excreted contrast material within nondilated renal collecting systems, ureters, and bladder.  Small amount retained contrast in colon.  Nonobstructive bowel gas pattern.  IMPRESSION: Malpositioned laparoscopic gastric band which has changed in orientation since the previous study.   Electronically Signed   By: Ulyses Southward M.D.   On: 04/27/2014 16:02    Anti-infectives: Anti-infectives    None      Assessment/Plan: s/p Procedure(s): Laparoscopic takedown of large anterior gastric prolapse and replication of the APS Lapband (N/A) Will get  upper GI today. If this looks ok then will start clears and see how she tolerates  LOS: 2 days    TOTH III,PAUL S 04/29/2014

## 2014-04-29 NOTE — Op Note (Signed)
NAMArville Lime:  Jocelyn Martinez, Jocelyn Martinez               ACCOUNT NO.:  [id number]  MEDICAL RECORD NO.:  [id number]  LOCATION:  [facility]                         [facility]  PHYSICIAN:  Thornton ParkMatthew B. Daphine DeutscherMartin, MD  DATE OF BIRTH:  1946-02-11  DATE OF PROCEDURE:  04/28/2014 DATE OF DISCHARGE:                              OPERATIVE REPORT   PREOPERATIVE DIAGNOSIS:  Anterior prolapse through APS lap band.  POSTOPERATIVE DIAGNOSIS:  Anterior prolapse through APS lap band.  PROCEDURE:  Laparoscopic takedown of anterior prolapse with recalibration and replication of the lap band.  SURGEON:  Thornton ParkMatthew B. Daphine DeutscherMartin, MD  ASSISTANT:  None.  DRAINS:  None.  ANESTHESIA:  General endotracheal.  DESCRIPTION OF PROCEDURE:  Ms. Francesco SorDorthy was taken to OR 1 on Friday, 04/28/2014, and given general anesthesia.  The abdomen was prepped with PCMX and draped sterilely.  After time-out, I entered the abdomen through the left upper quadrant with a 5 mm 0-degree Optiview without difficulty.  A second port on the left was used, again using her old scars to enter the abdomen with 5 mm.  Two more 5s were used on the right initially and also 5 mm in the upper midline for the [facility] retractor.  The band had a very tender 4 position and anteriorly there was a very large anterior slip.  There were few adhesions to the band anteriorly, so with the [facility] retractor, I was able to expose this very easily and with a little takedown of adhesions over the buckle, I was able to unbuckle the band.  With it unbuckled, I was able to reduce the anterior prolapse completely.  Calibration tubing was then passed and with that, we reduced a lot of old yucky material that was in the anterior prolapse.  With it across the band, I was then able to resituate it higher and in the normal position 2-8.  It was then snapped down over the calibration tubing and then held down in place while I then took 2 more purchases of the band.  Now, the lateral most  plication was a very wide bite and inside that and where the previous plication had failed, was the area that had herniated.  I went ahead and used the portion of the stomach that had herniated and plicated that anteriorly over the band to the small virtual pouch above.  Two good purchases were obtained using 2-0 Surgidac secured with a tie knot.  At the completion, again nice replication was present.  I injected all the ports with Exparel.  Everything looked good and the band was left empty.  Wounds were closed with 4-0 Vicryl and with liquid band on the skin.  Surgeon sustained a stick, but was with a clean needle on the closure with a 4-0 Vicryl.  The patient tolerated the procedure well, was taken to recovery room in satisfactory condition.     Thornton ParkMatthew B. Daphine DeutscherMartin, MD     MBM/MEDQ  D:  04/28/2014  T:  04/28/2014  Job:  [id number]

## 2014-04-29 NOTE — Progress Notes (Signed)
Dr. Carolynne Edouardoth made aware via phone of this am's UGI results, and to clarify diet progression orders. Orders received to begin clear liquid diet and KVO IV fluids.

## 2014-04-30 LAB — CBC WITH DIFFERENTIAL/PLATELET
Basophils Absolute: 0 10*3/uL (ref 0.0–0.1)
Basophils Relative: 0 % (ref 0–1)
Eosinophils Absolute: 0.1 10*3/uL (ref 0.0–0.7)
Eosinophils Relative: 1 % (ref 0–5)
HCT: 38.6 % (ref 36.0–46.0)
Hemoglobin: 12.5 g/dL (ref 12.0–15.0)
Lymphocytes Relative: 52 % — ABNORMAL HIGH (ref 12–46)
Lymphs Abs: 2.7 10*3/uL (ref 0.7–4.0)
MCH: 30.9 pg (ref 26.0–34.0)
MCHC: 32.4 g/dL (ref 30.0–36.0)
MCV: 95.5 fL (ref 78.0–100.0)
MONO ABS: 0.3 10*3/uL (ref 0.1–1.0)
Monocytes Relative: 6 % (ref 3–12)
Neutro Abs: 2.2 10*3/uL (ref 1.7–7.7)
Neutrophils Relative %: 41 % — ABNORMAL LOW (ref 43–77)
PLATELETS: 235 10*3/uL (ref 150–400)
RBC: 4.04 MIL/uL (ref 3.87–5.11)
RDW: 12.3 % (ref 11.5–15.5)
WBC: 5.2 10*3/uL (ref 4.0–10.5)

## 2014-04-30 LAB — GLUCOSE, CAPILLARY
GLUCOSE-CAPILLARY: 82 mg/dL (ref 70–99)
GLUCOSE-CAPILLARY: 85 mg/dL (ref 70–99)
Glucose-Capillary: 82 mg/dL (ref 70–99)
Glucose-Capillary: 83 mg/dL (ref 70–99)
Glucose-Capillary: 97 mg/dL (ref 70–99)
Glucose-Capillary: 98 mg/dL (ref 70–99)

## 2014-04-30 MED ORDER — MUPIROCIN 2 % EX OINT
1.0000 "application " | TOPICAL_OINTMENT | Freq: Two times a day (BID) | CUTANEOUS | Status: DC
  Administered 2014-04-30 – 2014-05-01 (×3): 1 via NASAL
  Filled 2014-04-30: qty 22

## 2014-04-30 MED ORDER — INSULIN ASPART 100 UNIT/ML ~~LOC~~ SOLN: 0.0000 [IU] | Freq: Three times a day (TID) | SUBCUTANEOUS | Status: DC

## 2014-04-30 MED ORDER — CHLORHEXIDINE GLUCONATE CLOTH 2 % EX PADS
6.0000 | MEDICATED_PAD | Freq: Every day | CUTANEOUS | Status: DC
  Administered 2014-04-30 – 2014-05-01 (×2): 6 via TOPICAL

## 2014-04-30 NOTE — Progress Notes (Signed)
2 Days Post-Op  Subjective: No complaints  Objective: Vital signs in last 24 hours: Temp:  [97.6 F (36.4 C)-98.1 F (36.7 C)] 97.6 F (36.4 C) (02/07 0518) Pulse Rate:  [75-82] 75 (02/07 0518) Resp:  [16-18] 18 (02/07 0518) BP: (101-130)/(46-60) 114/60 mmHg (02/07 0518) SpO2:  [96 %-99 %] 99 % (02/07 0518) Last BM Date: 04/26/14  Intake/Output from previous day: 02/06 0701 - 02/07 0700 In: 1006.5 [P.O.:300; I.V.:706.5] Out: 1500 [Urine:1500] Intake/Output this shift:    Resp: clear to auscultation bilaterally Cardio: regular rate and rhythm GI: soft, nontender  Lab Results:   Recent Labs  04/29/14 0530 04/30/14 0612  WBC 6.0 5.2  HGB 11.2* 12.5  HCT 33.4* 38.6  PLT 222 235   BMET  Recent Labs  04/27/14 1443 04/28/14 0500  NA 142 144  K 4.6 4.5  CL 104 110  CO2 26 27  GLUCOSE 146* 137*  BUN 22 20  CREATININE 0.90 0.79  CALCIUM 9.3 8.6   PT/INR No results for input(s): LABPROT, INR in the last 72 hours. ABG No results for input(s): PHART, HCO3 in the last 72 hours.  Invalid input(s): PCO2, PO2  Studies/Results: Dg Abd 1 View  04/28/2014   CLINICAL DATA:  Gastric band malfunction.  EXAM: ABDOMEN - 1 VIEW  COMPARISON:  Same day.  FINDINGS: The bowel gas pattern is normal. Gastric band appears to be in better position, oriented in the 2 o'clock to 8 o'clock plane. Small amount of stool is noted in the right colon. No abnormal calcifications are noted.  IMPRESSION: Gastric band appears to be in better position, now oriented in the 2:00 to 8:00 plane. These results will be called to the ordering clinician or representative by the Radiologist Assistant, and communication documented in the PACS or zVision Dashboard.   Electronically Signed   By: Roque LiasJames  Green M.D.   On: 04/28/2014 09:28   Dg Kayleen MemosUgi W/water Sol Cm  04/29/2014   CLINICAL DATA:  [age] year old female with history of prior lap band surgery with recent slippage of the lap band which has now been surgically  corrected. Postoperative evaluation to confirm patency and lap band positioning.  EXAM: WATER SOLUBLE UPPER GI SERIES  TECHNIQUE: Single-column upper GI series was performed using water soluble contrast.  CONTRAST:  20mL OMNIPAQUE IOHEXOL 300 MG/ML  SOLN  COMPARISON:  Prior radiographs 04/28/2014 and 04/27/2014  FLUOROSCOPY TIME:  If the device does not provide the exposure index:  Fluoroscopy Time (in minutes and seconds):  1 min 19 seconds  Number of Acquired Images:  7  FINDINGS: Initial spot films of the abdomen demonstrate a well-positioned laparoscopic gastric band without interval change in configuration compared to 04/28/2014. The phi angle is 61 degrees.  Subsequent real-time fluoroscopic evaluation during swallowing of water soluble contrast material demonstrates patency of the lumen and easy passage of contrast material into the stomach.  The patient remained asymptomatic throughout the study. No evidence of leak.  IMPRESSION: Well-positioned gastric lap band. No evidence of leak or other complicating feature.   Electronically Signed   By: Malachy MoanHeath  McCullough M.D.   On: 04/29/2014 10:16    Anti-infectives: Anti-infectives    None      Assessment/Plan: s/p Procedure(s): Laparoscopic takedown of large anterior gastric prolapse and replication of the APS Lapband (N/A) Advance diet. Will follow bariatric protocol Ambulate Plan for discharge tomorrow  LOS: 3 days    TOTH III,PAUL S 04/30/2014

## 2014-05-01 ENCOUNTER — Encounter (HOSPITAL_COMMUNITY): Payer: Self-pay | Admitting: Surgery

## 2014-05-01 LAB — GLUCOSE, CAPILLARY: GLUCOSE-CAPILLARY: 99 mg/dL (ref 70–99)

## 2014-05-01 MED ORDER — OXYCODONE HCL 5 MG/5ML PO SOLN: 5.0000 mg | ORAL | Status: AC | PRN

## 2014-05-01 NOTE — Discharge Summary (Signed)
Physician Discharge Summary  Patient ID: DORCUS RIGA MRN: [id number] DOB/AGE: 67-Aug-1947 67 y.o.  Admit date: 04/27/2014 Discharge date: 05/01/2014  Admission Diagnoses:  Anterior gastric band slip with obstruction  Discharge Diagnoses:  same  Active Problems:   * No active hospital problems. *   Surgery:  Laparoscopic takedown of anterior gastric slip and replication of lapband APS  Discharged Condition: improved  Hospital Course:   Admitted on Thursday;  Didn't get better overnight with removal of fluid from band.  Taken to OR on Friday morning and reduction of slip and replication performed.  Did well and followed over the weekend by my partners.  Ready for discharge on Monday.  UGI on Sat looked good.  Consults: none  Significant Diagnostic Studies: UGI    Discharge Exam: Blood pressure 99/51, pulse 71, temperature 97.8 F (36.6 C), temperature source Oral, resp. rate 18, height  (1.575 m), weight 207 lb (93.895 kg), SpO2 98 %. Incisions OK  Disposition: 01-Home or Self Care  Discharge Instructions    Call MD for:  persistant nausea and vomiting    Complete by:  As directed      Call MD for:  severe uncontrolled pain    Complete by:  As directed      Diet Carb Modified    Complete by:  As directed      Discharge instructions    Complete by:  As directed   Followup for first lapband fill in 4 weeks     Increase activity slowly    Complete by:  As directed      Other Restrictions    Complete by:  As directed   Follow restrict carb intake to prevent weight regain while band is not distended            Medication List    TAKE these medications        acetaminophen 650 MG CR tablet  Commonly known as:  TYLENOL  Take 2,600 mg by mouth at bedtime.     b complex vitamins tablet  Take 1 tablet by mouth daily.     Biotin 1000 MCG tablet  Take 1,000 mcg by mouth 3 (three) times daily.     calcium carbonate 600 MG Tabs tablet  Commonly known as:  OS-CAL   Take 600 mg by mouth 3 (three) times daily with meals.     DULoxetine 60 MG capsule  Commonly known as:  CYMBALTA  Take 60 mg by mouth every morning.     JANUVIA 100 MG tablet  Generic drug:  sitaGLIPtin  Take 100 mg by mouth daily.     levothyroxine 137 MCG tablet  Commonly known as:  SYNTHROID, LEVOTHROID  Take 137 mcg by mouth daily before breakfast.     lisinopril 20 MG tablet  Commonly known as:  PRINIVIL,ZESTRIL  Take 20 mg by mouth daily.     meloxicam 15 MG tablet  Commonly known as:  MOBIC  Take 15 mg by mouth daily.     multivitamin with minerals Tabs tablet  Take 1 tablet by mouth daily.     oxyCODONE 5 MG/5ML solution  Commonly known as:  ROXICODONE  Take 5-10 mLs (5-10 mg total) by mouth every 4 (four) hours as needed for moderate pain or severe pain.     polyvinyl alcohol 1.4 % ophthalmic solution  Commonly known as:  LIQUIFILM TEARS  Place 1 drop into both eyes as needed for dry eyes.  pravastatin 40 MG tablet  Commonly known as:  PRAVACHOL  Take 40 mg by mouth at bedtime.     pregabalin 75 MG capsule  Commonly known as:  LYRICA  Take 75 mg by mouth 3 (three) times daily.     solifenacin 10 MG tablet  Commonly known as:  VESICARE  Take 10 mg by mouth daily.           Follow-up Information    Follow up with Luretha MurphyMARTIN,Doneen Ollinger B, MD. Schedule an appointment as soon as possible for a visit in 4 weeks.   Specialty:  General Surgery   Contact information:   [address] [place] [address] [phone]       Signed: Valarie MerinoMARTIN,Charley Miske B 05/01/2014, 8:00 AM

## 2014-05-01 NOTE — Discharge Instructions (Signed)
Laparoscopic Gastric Band Surgery, Care After °These instructions give you information on caring for yourself after your procedure. Your doctor may also give you more specific instructions. Call your doctor if you have any problems or questions after your procedure. °HOME CARE  °· Take walks throughout the day. Do not sit for longer than 1 hour while awake for 4 to 6 weeks. °· You may shower 2 days after surgery. Pat the surgery cuts (incisions) dry. Do not rub the surgery cuts. °· Do your coughing and deep breathing exercises. °· Do not lift, push, or pull anything heavy until your doctor says it is okay. °· Only take medicines as told by your doctor. Do not drive while taking pain medicine. °· Drink plenty of fluids to keep your pee (urine) clear or pale yellow. °· Stay on a liquid diet as long as your doctor tells you to. °· Do not drink caffeine for 1 month. °· Change bandages (dressings) as told by your doctor. °· Check your surgery cuts for redness, puffiness (swelling), abnormal coloring, fluid, or bleeding. °· Follow your doctor's advice about vitamin and protein needs after surgery. °GET HELP RIGHT AWAY IF: °· You feel sick to your stomach (nauseous) and throw up (vomit). °· You have pain and discomfort with swallowing. °· You develop shortness of breath or difficulty breathing. °· You have pain, puffiness, or feel warmth on your lower body. °· You have very bad calf pain or pain not relieved by medicine. °· You have a temperature by mouth above 102° F (38.9° C). °· Your surgery cuts look red, puffy, or they leak fluid. °· Your poop (stool) is black, tarry, or dark red. °· You have chills. °· You have chest pain. °· You feel confused. °· You have slurred speech. °· You feel light-headed when standing. °· You suddenly feel weak. °· You have any questions or concerns. °MAKE SURE YOU:  °· Understand these instructions. °· Will watch your condition. °· Will get help right away if you are not doing well or get  worse. °Document Released: 04/12/2010 Document Revised: 07/25/2013 Document Reviewed: 04/12/2010 °[employer]® Patient Information ©2015 [employer]. This information is not intended to replace advice given to you by your health care provider. Make sure you discuss any questions you have with your health care provider. ° °

## 2014-06-19 ENCOUNTER — Encounter: Payer: Medicare Other | Attending: Surgery | Admitting: Dietician

## 2014-06-19 DIAGNOSIS — Z713 Dietary counseling and surveillance: Secondary | ICD-10-CM | POA: Insufficient documentation

## 2014-06-19 DIAGNOSIS — Z6839 Body mass index (BMI) 39.0-39.9, adult: Secondary | ICD-10-CM | POA: Insufficient documentation

## 2014-06-19 NOTE — Patient Instructions (Addendum)
-  Keep working on eating slowly and chewing thoroughly  -Try putting fork down between each bite  -Ask daughter to help you remember to slow down  TANITA  BODY COMP RESULTS  07/05/13 08/05/13 09/19/13 12/19/13 03/20/14 06/19/14   BMI (kg/m^2) 50.4 48.5 46.6 42.4 40.7 39   Fat Mass (lbs) 158.0 152 140.5 122.5 115 105.5   Fat Free Mass (lbs) 117.5 113 114.5 109.5 107.5 108   Total Body Water (lbs) 86.0 82.5 84 80 78.5 79

## 2014-06-19 NOTE — Progress Notes (Signed)
  Follow-up visit: 12 months Post-Operative LAGB Surgery  Medical Nutrition Therapy:  Appt start time: 200 end time: 230  Primary concerns today: Post-operative Bariatric Surgery Nutrition Management.  Jasmine DecemberSharon returns today having lost another 9 pounds. She reports that her band slipped in January/February and she had to have it surgically repaired. She has had 1 fill since and has another fill scheduled mid April. Had been down to 207 lbs. She reports feeling much better and meeting fluid and protein needs.  Surgery date: 06/21/2013 Surgery type: LAGB [facility] Weight today: 213.5 lbs Weight change: 9 lbs Total weight loss: 76.5 lbs   TANITA  BODY COMP RESULTS  07/05/13 08/05/13 09/19/13 12/19/13 03/20/14 06/19/14   BMI (kg/m^2) 50.4 48.5 46.6 42.4 40.7 39   Fat Mass (lbs) 158.0 152 140.5 122.5 115 105.5   Fat Free Mass (lbs) 117.5 113 114.5 109.5 107.5 108   Total Body Water (lbs) 86.0 82.5 84 80 78.5 79    Preferred Learning Style:   No preference indicated   Learning Readiness:   Ready  24-hr recall: B (AM): coffee and sugar free kool aid, yogurt (12g) Snk (AM):  L (PM): salad with chicken or boiled egg or tuna (7-21g) Snk ( PM):  D ( PM): 3oz fish, chicken, or lean ground beef cooked in cooking spray and salad or slaw and green beans (21 g) Snk (PM): sometimes a protein shake (17g) or yogurt (12g)  Fluid intake: 64-96 oz of water with Sallye OberKool Aid SF packet, decaf unsweet tea, coffee, and shakes Estimated total protein intake: 60+g per day  Medications: see list Supplementation: taking  CBG monitoring: inconsistent Average CBG per patient: "no spikes" Last patient reported A1c: 5.8% (May or June of 2015)  Using straws: no Drinking while eating: no Hair loss: no longer having hair loss Carbonated beverages: no N/V/D/C: none Last Lap-Band fill: 1 recent fill after having band surgically repaired in February 2016  Recent physical activity:  "spring  cleaning" house and plans to start gardening and walking dogs soon - got a pedometer  Progress Towards Goal(s):  In progress.   Nutritional Diagnosis:  [place]-3.3 Overweight/obesity related to past poor dietary habits and physical inactivity as evidenced by patient w/ recent LAGB surgery following dietary guidelines for continued weight loss.  Intervention:  Nutrition counseling provided.  Teaching Method Utilized:  Visual Auditory  Barriers to learning/adherence to lifestyle change: none  Demonstrated degree of understanding via:  Teach Back   Monitoring/Evaluation:  Dietary intake, exercise, lap band fills, and body weight. Follow up in 3 months for 15 month post-op visit.

## 2014-09-19 ENCOUNTER — Ambulatory Visit: Payer: No Typology Code available for payment source | Admitting: Dietician

## 2015-07-02 IMAGING — RF DG UGI W/ KUB
12 of 17 series · 15 of 24 positions shown · non-contrast
Comparison: None.

FLUOROSCOPY TIME:  1 min 18 seconds

CLINICAL DATA: Bariatric screening

EXAM:
UPPER GI SERIES W/ KUB
TECHNIQUE: After obtaining a scout radiograph a routine upper GI series was
performed using thin barium.

[Series 1: run · 1 of 8 slices shown (1 of 11)]
[im 1/8]
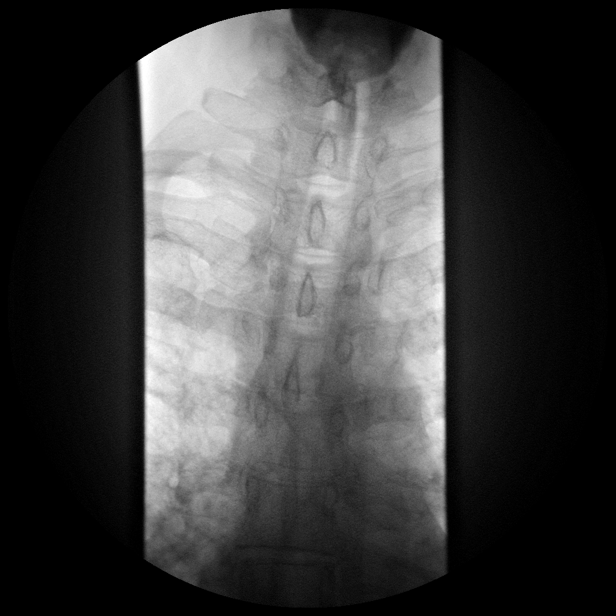

[Series 2: run · 1 of 5 slices shown (2 of 11)]
[im 1/5]
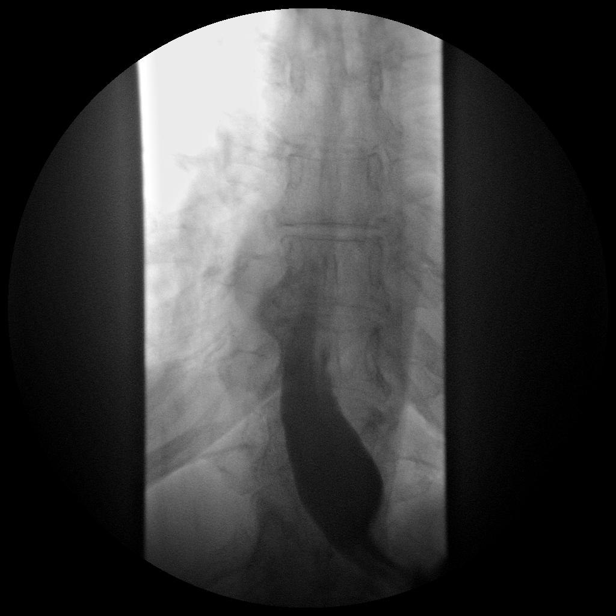

[Series 3: run · 2 of 7 slices shown (3 of 11)]
[im 1/7]
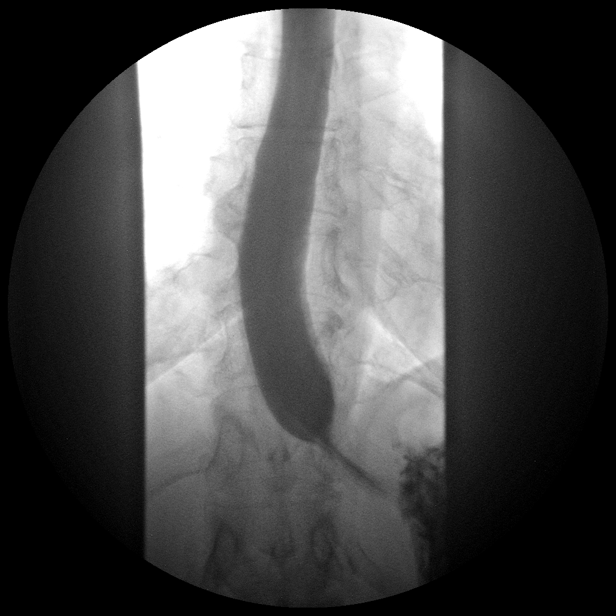
[im 4/7]
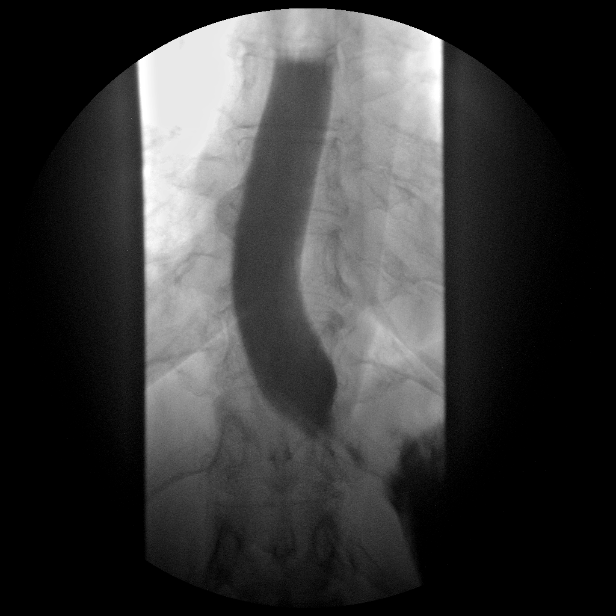

[Series 4: run · 1 of 1 slices shown (4 of 11)]
[im 1/1]
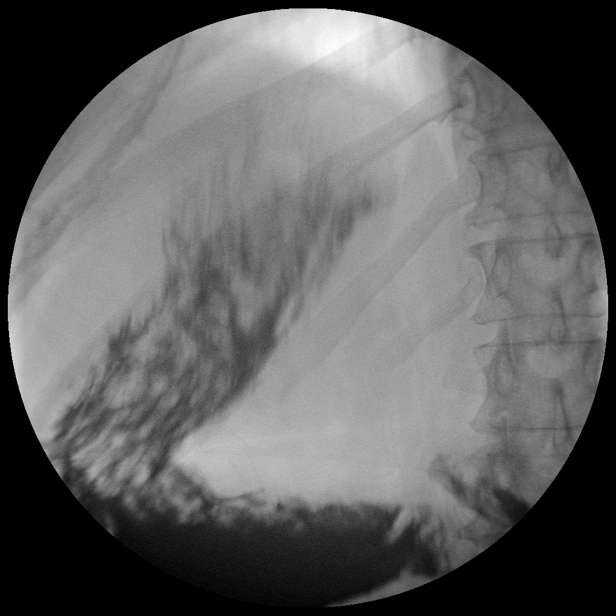

[Series 5: run · 1 of 1 slices shown (5 of 11)]
[im 1/1]
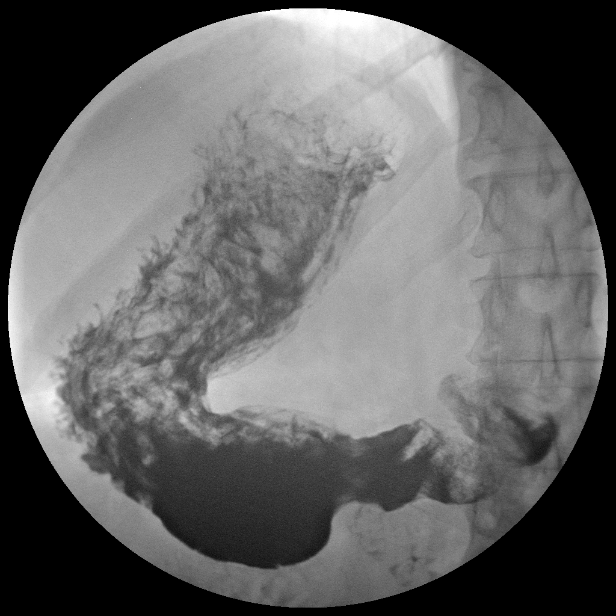

[Series 7: run · 1 of 1 slices shown (6 of 11)]
[im 1/1]
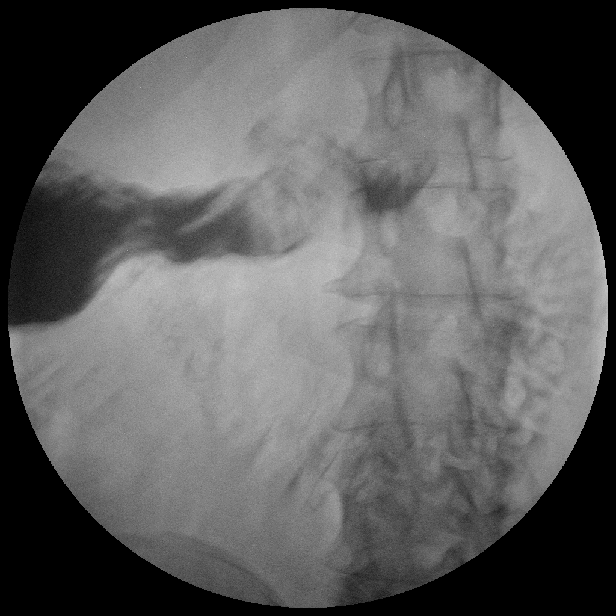

[Series 9: run · 2 of 5 slices shown (7 of 11)]
[im 1/5]
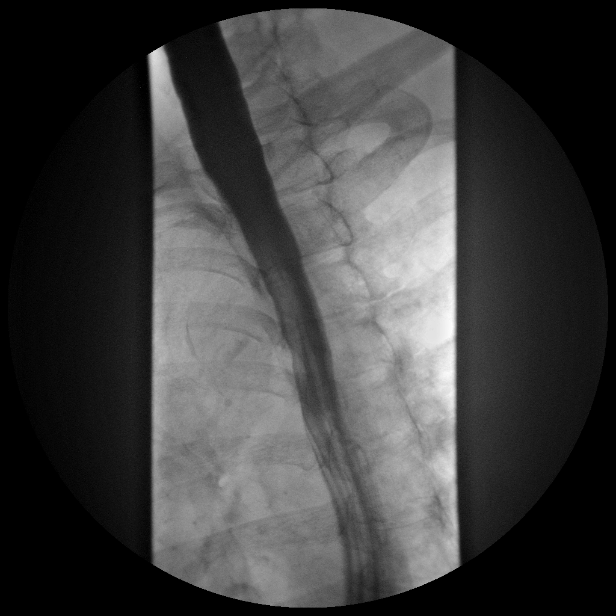
[im 5/5]
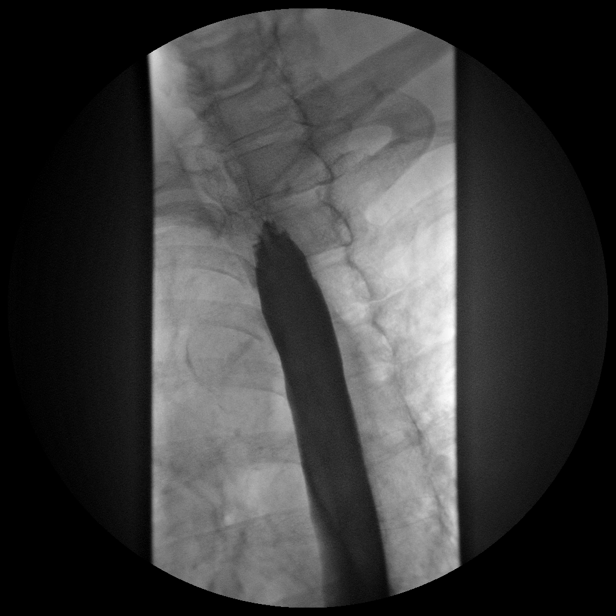

[Series 10: run · 2 of 7 slices shown (8 of 11)]
[im 4/7]
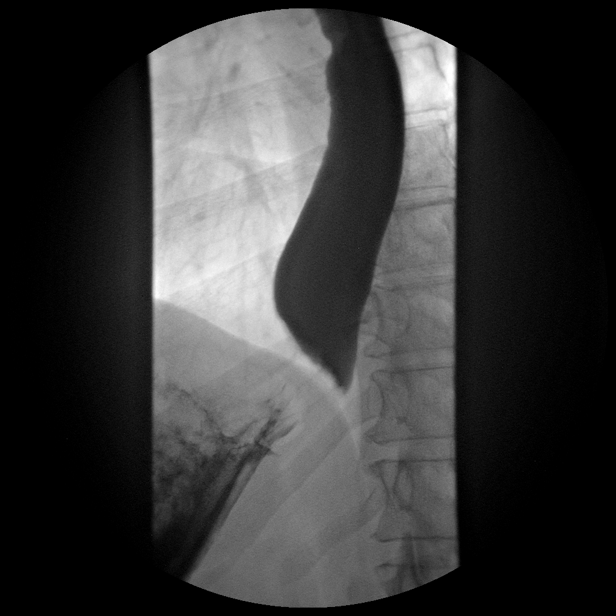
[im 7/7]
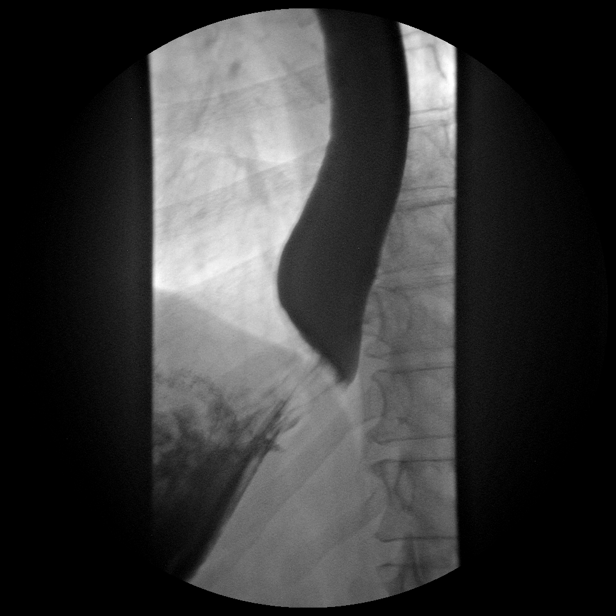

[Series 12: run · 1 of 1 slices shown (9 of 11)]
[im 1/1]
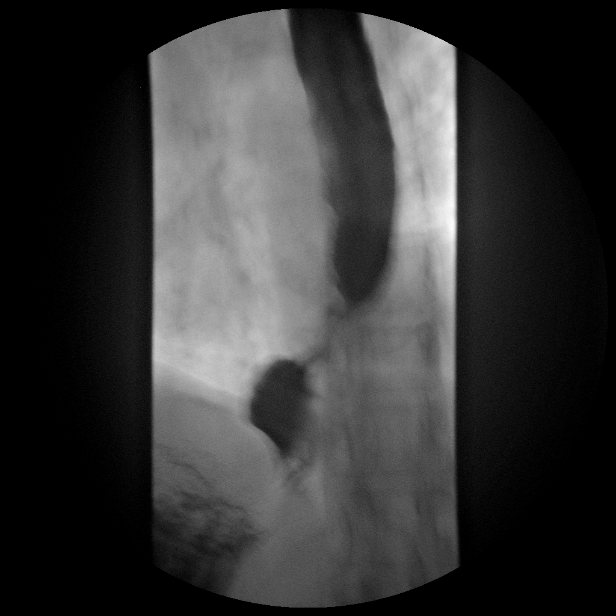

[Series 14: run · 1 of 1 slices shown (10 of 11)]
[im 1/1]
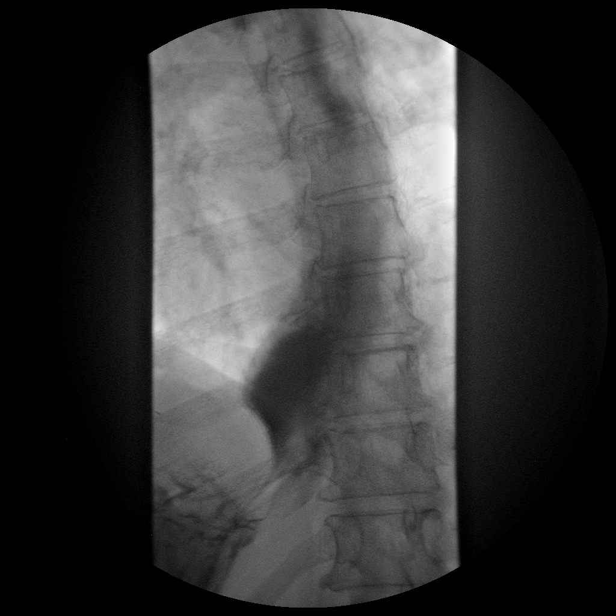

[Series 15: run · 1 of 1 slices shown (11 of 11)]
[im 1/1]
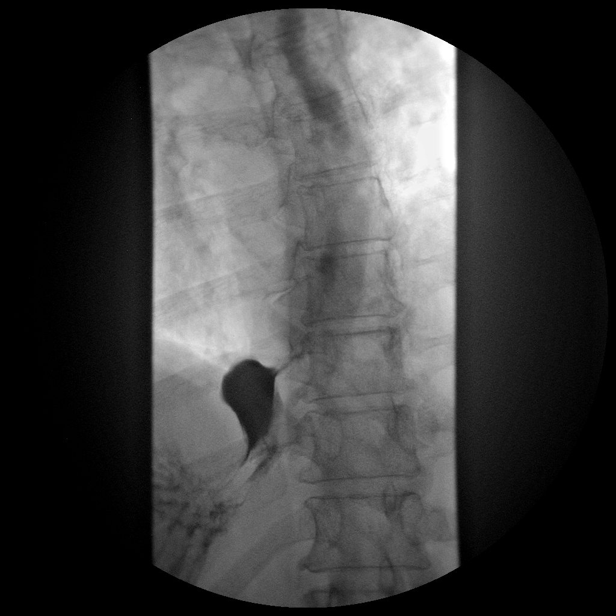

[Series 1002: view not recorded · 0.20mm/px · 1 of 1 slices shown]
[im 1/1]
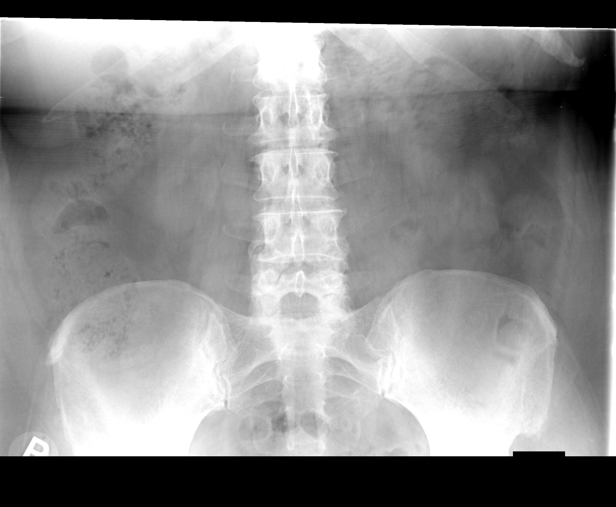

[15 of 24 positions shown; findings below may reference images not displayed]

FINDINGS: Scout radiographs demonstrate a nonobstructive bowel gas pattern.

Mild esophageal dysmotility with multiple tertiary (non peristaltic)
contractions during repeated prone swallows.

No fixed esophageal narrowing or stricture.

Small hiatal hernia. Gastroesophageal reflux could be to be assessed
due to the patient's inability to clear her esophagus in the prone
position.

Normal gastric poles.

Proximal duodenum is within normal limits.
IMPRESSION: Small hiatal hernia.

Mild esophageal dysmotility.

## 2016-06-30 ENCOUNTER — Telehealth: Payer: Self-pay | Admitting: Unknown Physician Specialty

## 2016-06-30 NOTE — Telephone Encounter (Signed)
Hailey with [employer] called regarding needing some documentation if possible on this patient from before 06-21-2013.  They are needing documentation to help support the reason patient had bariatric surgery.  There is a deadline this week so she is hoping to get the information by tomorrow.    I told her I could not see anything in epic and I also looked some in [facility] but thought you may know where to look for this information  Fax to [phone]   Gladys Damme can be reached @ [phone]  Thanks

## 2016-06-30 NOTE — Telephone Encounter (Signed)
Called and spoke with Hailey. She states that she is needing any supporting documentation as to why the patient was needing bariatric surgery. She stated that she has already received notes our notes from 07/2012 through 02/2013. Stated that she got these from [facility]. I told her that I did not see where a referral was generated from [place] regarding this surgery or anything other than what she already had. The only I read that she did not have was the OV note from 05/17/13 was  "Pt would like to get gastric bypass. She has tried to lose weight without success." Hailey asked if I could send her this OV note. I told her that I would and she thanked me for my help.

## 2016-07-01 NOTE — Telephone Encounter (Signed)
Information faxed to [facility].

## 2019-06-24 ENCOUNTER — Ambulatory Visit: Payer: Medicare Other | Attending: Internal Medicine

## 2019-06-24 DIAGNOSIS — Z23 Encounter for immunization: Secondary | ICD-10-CM

## 2019-06-24 NOTE — Progress Notes (Signed)
   Covid-19 Vaccination Clinic  Name:  Jocelyn Martinez    MRN: [id number] DOB: 1945/12/31  06/24/2019  [name] was observed post Covid-19 immunization for 15 minutes without incident. She was provided with Vaccine Information Sheet and instruction to access the V-Safe system.   [name] was instructed to call 911 with any severe reactions post vaccine: Marland Kitchen Difficulty breathing  . Swelling of face and throat  . A fast heartbeat  . A bad rash all over body  . Dizziness and weakness   Immunizations Administered    Name Date Dose VIS Date Route   Pfizer COVID-19 Vaccine 06/24/2019  9:53 AM 0.3 mL 03/04/2019 Intramuscular   Manufacturer: [employer], [employer]   Lot: [id number]   NDC: [address]-9

## 2019-07-20 ENCOUNTER — Ambulatory Visit: Payer: Medicare Other | Attending: Internal Medicine

## 2019-07-20 DIAGNOSIS — Z23 Encounter for immunization: Secondary | ICD-10-CM

## 2019-07-20 NOTE — Progress Notes (Signed)
   Covid-19 Vaccination Clinic  Name:  [name]    MRN: [id number] DOB: March 18, 1946  07/20/2019  [name] was observed post Covid-19 immunization for 15 minutes without incident. She was provided with Vaccine Information Sheet and instruction to access the V-Safe system.   [name] was instructed to call 911 with any severe reactions post vaccine: Marland Kitchen Difficulty breathing  . Swelling of face and throat  . A fast heartbeat  . A bad rash all over body  . Dizziness and weakness   Immunizations Administered    Name Date Dose VIS Date Route   Pfizer COVID-19 Vaccine 07/20/2019  9:36 AM 0.3 mL 05/18/2018 Intramuscular   Manufacturer: [employer], [employer]   Lot: [id number]   NDC: [address]-8
